# Patient Record
Sex: Female | Born: 1980 | Race: White | Hispanic: Yes | Marital: Single | Smoking: Never smoker
Health system: Southern US, Community
[De-identification: ages and names within clinical notes are randomized; demographics above are authoritative.]

## PROBLEM LIST (undated history)

## (undated) ENCOUNTER — Inpatient Hospital Stay (HOSPITAL_COMMUNITY): Payer: Self-pay

## (undated) DIAGNOSIS — F329 Major depressive disorder, single episode, unspecified: Secondary | ICD-10-CM

## (undated) DIAGNOSIS — R51 Headache: Secondary | ICD-10-CM

## (undated) DIAGNOSIS — R519 Headache, unspecified: Secondary | ICD-10-CM

## (undated) DIAGNOSIS — K219 Gastro-esophageal reflux disease without esophagitis: Secondary | ICD-10-CM

## (undated) DIAGNOSIS — Z91410 Personal history of adult physical and sexual abuse: Secondary | ICD-10-CM

## (undated) DIAGNOSIS — K117 Disturbances of salivary secretion: Secondary | ICD-10-CM

## (undated) DIAGNOSIS — F32A Depression, unspecified: Secondary | ICD-10-CM

## (undated) HISTORY — DX: Personal history of adult physical and sexual abuse: Z91.410

## (undated) HISTORY — DX: Disturbances of salivary secretion: K11.7

## (undated) HISTORY — DX: Depression, unspecified: F32.A

## (undated) HISTORY — DX: Major depressive disorder, single episode, unspecified: F32.9

---

## 2009-08-09 ENCOUNTER — Ambulatory Visit: Payer: Self-pay | Admitting: Family Medicine

## 2009-08-09 ENCOUNTER — Inpatient Hospital Stay (HOSPITAL_COMMUNITY): Admission: AD | Admit: 2009-08-09 | Discharge: 2009-08-12 | Payer: Self-pay | Admitting: Obstetrics & Gynecology

## 2009-08-21 ENCOUNTER — Ambulatory Visit: Payer: Self-pay | Admitting: Obstetrics & Gynecology

## 2009-08-21 ENCOUNTER — Inpatient Hospital Stay (HOSPITAL_COMMUNITY): Admission: AD | Admit: 2009-08-21 | Discharge: 2009-08-22 | Payer: Self-pay | Admitting: Obstetrics & Gynecology

## 2009-10-29 ENCOUNTER — Ambulatory Visit (HOSPITAL_COMMUNITY): Admission: RE | Admit: 2009-10-29 | Discharge: 2009-10-29 | Payer: Self-pay | Admitting: Obstetrics & Gynecology

## 2010-03-23 ENCOUNTER — Inpatient Hospital Stay (HOSPITAL_COMMUNITY): Admission: AD | Admit: 2010-03-23 | Discharge: 2010-03-23 | Payer: Self-pay | Admitting: Obstetrics & Gynecology

## 2010-03-23 ENCOUNTER — Ambulatory Visit: Payer: Self-pay | Admitting: Family

## 2010-03-24 ENCOUNTER — Inpatient Hospital Stay (HOSPITAL_COMMUNITY): Admission: AD | Admit: 2010-03-24 | Discharge: 2010-03-27 | Payer: Self-pay | Admitting: Obstetrics & Gynecology

## 2010-03-24 ENCOUNTER — Ambulatory Visit: Payer: Self-pay | Admitting: Family Medicine

## 2010-12-18 LAB — URINE MICROSCOPIC-ADD ON

## 2010-12-18 LAB — CBC
HCT: 30.9 % — ABNORMAL LOW (ref 36.0–46.0)
Hemoglobin: 10.4 g/dL — ABNORMAL LOW (ref 12.0–15.0)
Hemoglobin: 11.7 g/dL — ABNORMAL LOW (ref 12.0–15.0)
MCH: 28.5 pg (ref 26.0–34.0)
MCH: 28.9 pg (ref 26.0–34.0)
MCHC: 34.3 g/dL (ref 30.0–36.0)
MCV: 84.4 fL (ref 78.0–100.0)
MCV: 84.5 fL (ref 78.0–100.0)
Platelets: 147 10*3/uL — ABNORMAL LOW (ref 150–400)
RBC: 3.66 MIL/uL — ABNORMAL LOW (ref 3.87–5.11)
RBC: 4.05 MIL/uL (ref 3.87–5.11)
WBC: 6.2 10*3/uL (ref 4.0–10.5)

## 2010-12-18 LAB — COMPREHENSIVE METABOLIC PANEL
AST: 23 U/L (ref 0–37)
Albumin: 2.5 g/dL — ABNORMAL LOW (ref 3.5–5.2)
Alkaline Phosphatase: 163 U/L — ABNORMAL HIGH (ref 39–117)
BUN: 6 mg/dL (ref 6–23)
CO2: 19 mEq/L (ref 19–32)
Chloride: 108 mEq/L (ref 96–112)
Creatinine, Ser: 0.42 mg/dL (ref 0.4–1.2)
GFR calc non Af Amer: >60 mL/min (ref >60)
Potassium: 3.4 mEq/L — ABNORMAL LOW (ref 3.5–5.1)
Total Bilirubin: 0.4 mg/dL (ref 0.3–1.2)

## 2010-12-18 LAB — URINALYSIS, ROUTINE W REFLEX MICROSCOPIC
Bilirubin Urine: NEGATIVE
Ketones, ur: NEGATIVE mg/dL
Nitrite: NEGATIVE
Protein, ur: 100 mg/dL — AB
Urobilinogen, UA: 0.2 mg/dL (ref 0.0–1.0)

## 2010-12-18 LAB — PROTEIN / CREATININE RATIO, URINE: Creatinine, Urine: 107.5 mg/dL

## 2011-01-04 LAB — COMPREHENSIVE METABOLIC PANEL
ALT: 58 U/L — ABNORMAL HIGH (ref 0–35)
ALT: 71 U/L — ABNORMAL HIGH (ref 0–35)
AST: 38 U/L — ABNORMAL HIGH (ref 0–37)
Albumin: 2.7 g/dL — ABNORMAL LOW (ref 3.5–5.2)
Alkaline Phosphatase: 50 U/L (ref 39–117)
Alkaline Phosphatase: 66 U/L (ref 39–117)
BUN: 3 mg/dL — ABNORMAL LOW (ref 6–23)
CO2: 23 mEq/L (ref 19–32)
CO2: 24 mEq/L (ref 19–32)
CO2: 27 mEq/L (ref 19–32)
Calcium: 8.6 mg/dL (ref 8.4–10.5)
Chloride: 110 mEq/L (ref 96–112)
Chloride: 105 mEq/L (ref 96–112)
Creatinine, Ser: 0.44 mg/dL (ref 0.4–1.2)
GFR calc Af Amer: >60 mL/min (ref >60)
GFR calc non Af Amer: >60 mL/min (ref >60)
GFR calc non Af Amer: >60 mL/min (ref >60)
GFR calc non Af Amer: >60 mL/min (ref >60)
Glucose, Bld: 102 mg/dL — ABNORMAL HIGH (ref 70–99)
Glucose, Bld: 107 mg/dL — ABNORMAL HIGH (ref 70–99)
Potassium: 3.6 mEq/L (ref 3.5–5.1)
Potassium: 4 mEq/L (ref 3.5–5.1)
Sodium: 134 mEq/L — ABNORMAL LOW (ref 135–145)
Sodium: 138 mEq/L (ref 135–145)
Total Bilirubin: 0.6 mg/dL (ref 0.3–1.2)
Total Protein: 5.7 g/dL — ABNORMAL LOW (ref 6.0–8.3)

## 2011-01-04 LAB — DIFFERENTIAL
Basophils Relative: 1 % (ref 0–1)
Eosinophils Absolute: 0 10*3/uL (ref 0.0–0.7)
Eosinophils Relative: 0 % (ref 0–5)
Lymphocytes Relative: 12 % (ref 12–46)
Lymphs Abs: 1.4 10*3/uL (ref 0.7–4.0)
Monocytes Absolute: 0.6 10*3/uL (ref 0.1–1.0)
Monocytes Relative: 5 % (ref 3–12)
Neutrophils Relative %: 79 % — ABNORMAL HIGH (ref 43–77)

## 2011-01-04 LAB — POCT PREGNANCY, URINE: Preg Test, Ur: POSITIVE

## 2011-01-04 LAB — URINALYSIS, ROUTINE W REFLEX MICROSCOPIC
Bilirubin Urine: NEGATIVE
Glucose, UA: NEGATIVE mg/dL
Hgb urine dipstick: NEGATIVE
Ketones, ur: 15 mg/dL — AB
Ketones, ur: NEGATIVE mg/dL
Nitrite: NEGATIVE
Protein, ur: NEGATIVE mg/dL
Specific Gravity, Urine: 1.025 (ref 1.005–1.030)
pH: 5.5 (ref 5.0–8.0)

## 2011-01-04 LAB — URINE MICROSCOPIC-ADD ON

## 2011-01-04 LAB — BASIC METABOLIC PANEL
GFR calc Af Amer: >60 mL/min (ref >60)
GFR calc non Af Amer: >60 mL/min (ref >60)
Potassium: 2.7 mEq/L — CL (ref 3.5–5.1)
Sodium: 133 mEq/L — ABNORMAL LOW (ref 135–145)

## 2011-01-04 LAB — CBC
HCT: 32.7 % — ABNORMAL LOW (ref 36.0–46.0)
HCT: 45.1 % (ref 36.0–46.0)
Hemoglobin: 15.6 g/dL — ABNORMAL HIGH (ref 12.0–15.0)
Hemoglobin: 14.1 g/dL (ref 12.0–15.0)
MCV: 94 fL (ref 78.0–100.0)
Platelets: 183 10*3/uL (ref 150–400)
Platelets: 250 10*3/uL (ref 150–400)
RBC: 4.85 MIL/uL (ref 3.87–5.11)
RBC: 4.4 MIL/uL (ref 3.87–5.11)
WBC: 11.7 10*3/uL — ABNORMAL HIGH (ref 4.0–10.5)
WBC: 6.4 10*3/uL (ref 4.0–10.5)

## 2011-01-04 LAB — HEPATITIS C VRS RNA DETECT BY PCR-QUAL: Hepatitis C Vrs RNA by PCR-Qual: NEGATIVE

## 2011-01-04 LAB — TSH: TSH: 1.149 u[IU]/mL (ref 0.350–4.500)

## 2011-02-14 IMAGING — US US ABDOMEN COMPLETE
1 series · 14 of 25 positions shown · non-contrast
Comparison: None.

CLINICAL DATA: Diffuse abdominal pain.  Nausea vomiting.  Elevated
LFTs.  Leukocytosis.

ABDOMEN ULTRASOUND
TECHNIQUE: Complete abdominal ultrasound examination was performed
including evaluation of the liver, gallbladder, bile ducts,
pancreas, kidneys, spleen, IVC, and abdominal aorta.

[Series 1: us abdomen complete · 0.21mm/px · 14 of 98 slices shown]
[im 1/98]
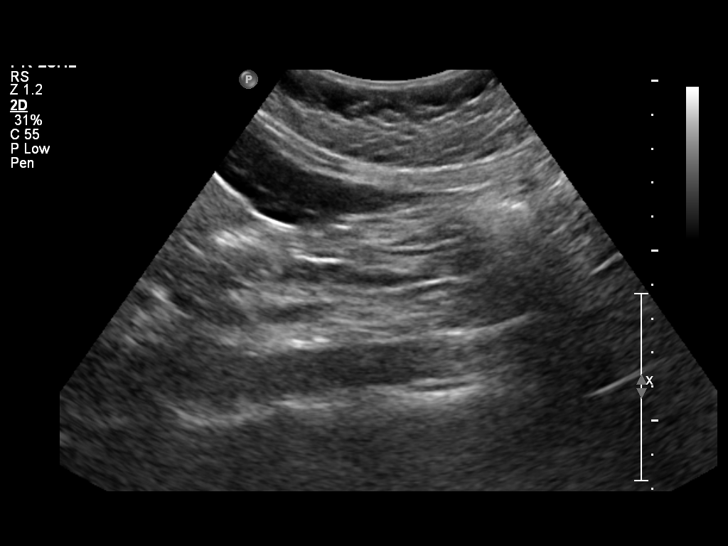
[im 9/98]
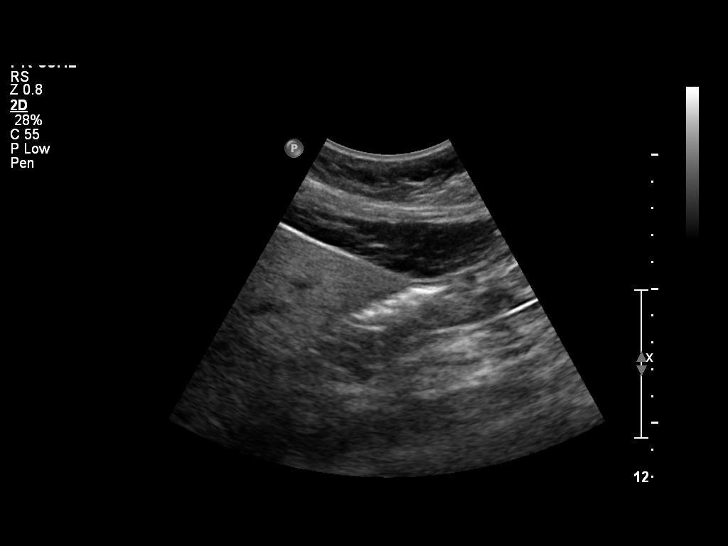
[im 17/98]
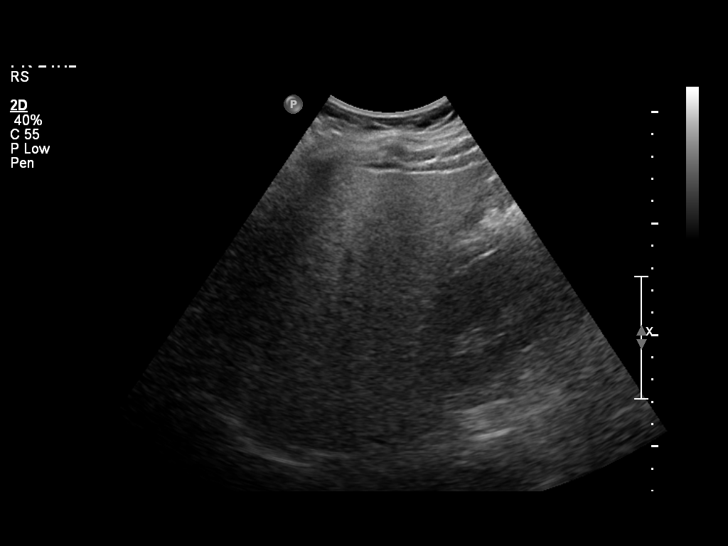
[im 25/98]
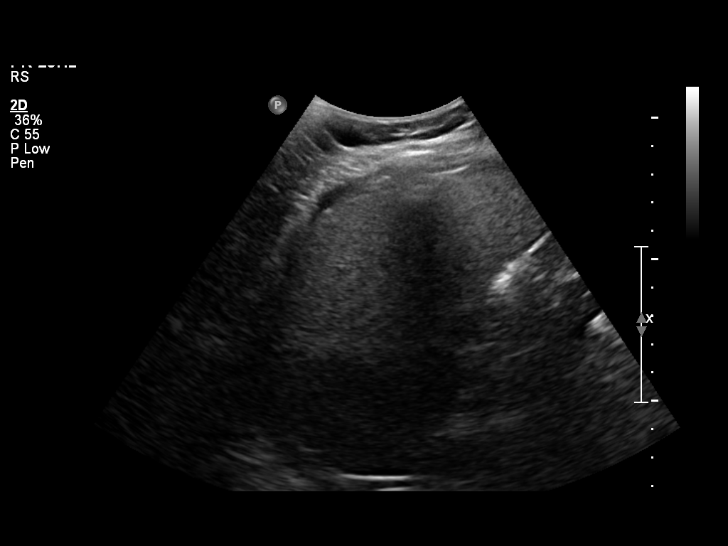
[im 33/98]
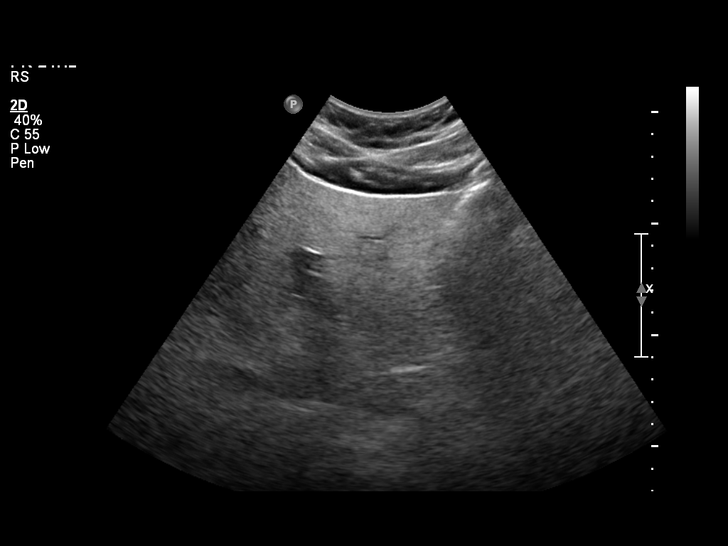
[im 37/98]
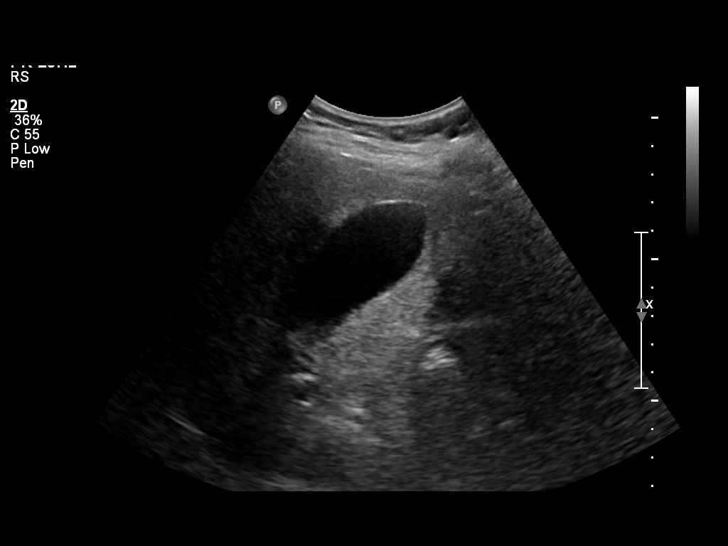
[im 45/98]
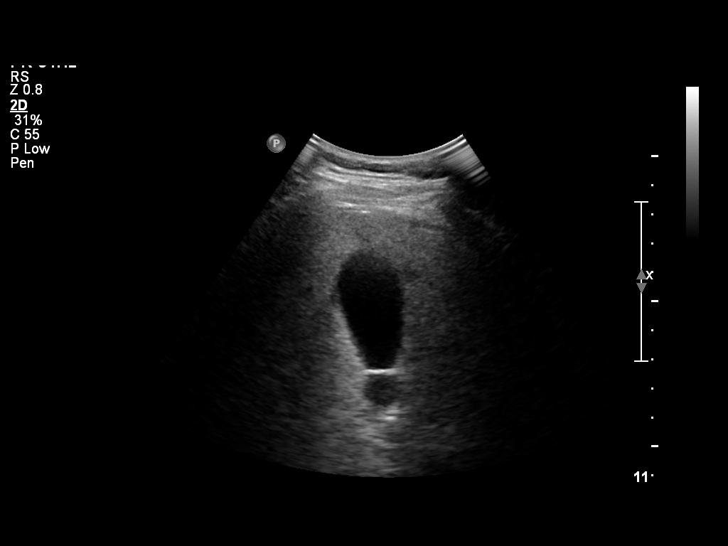
[im 53/98]
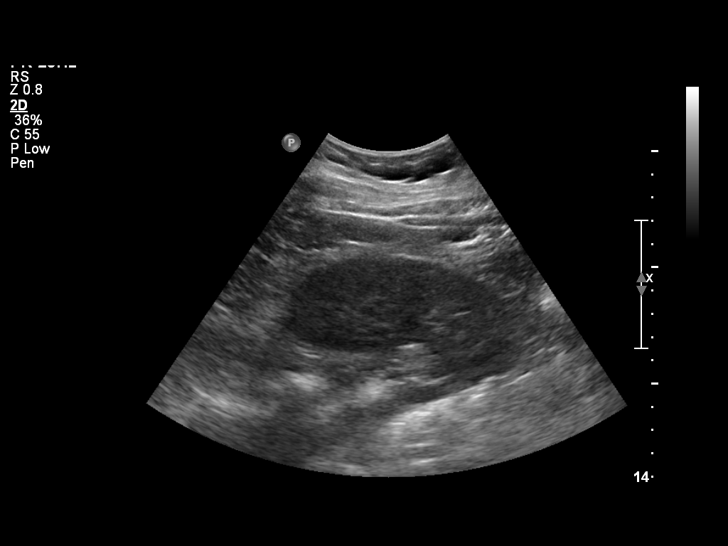
[im 61/98]
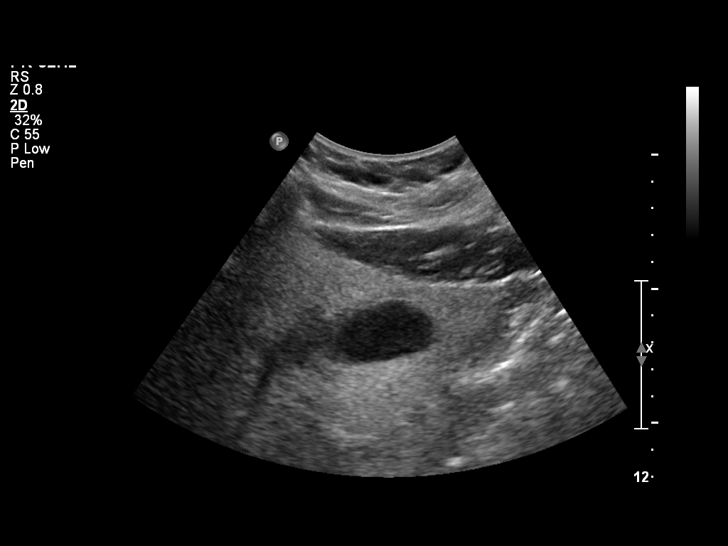
[im 65/98]
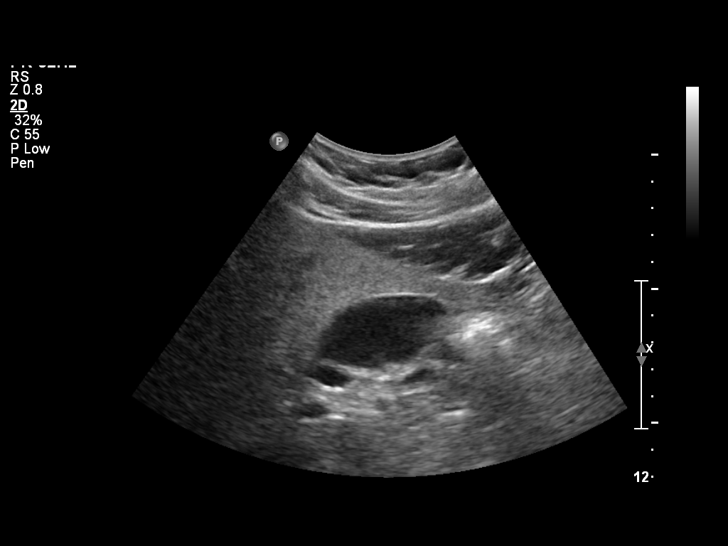
[im 73/98]
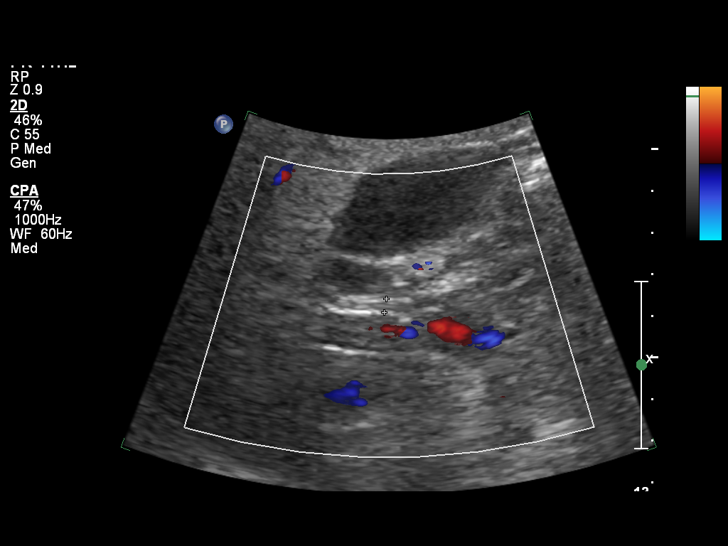
[im 81/98]
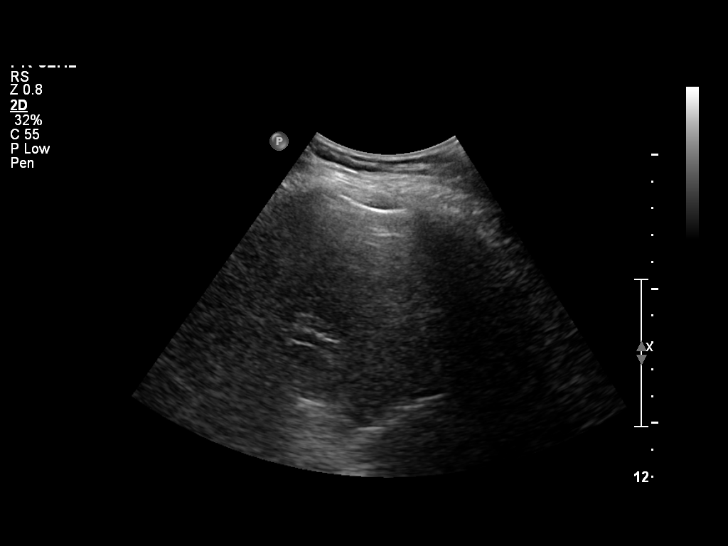
[im 89/98]
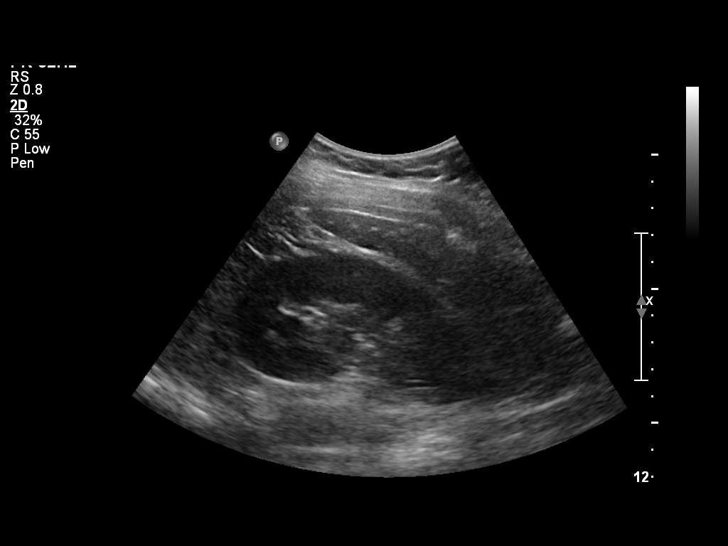
[im 98/98]
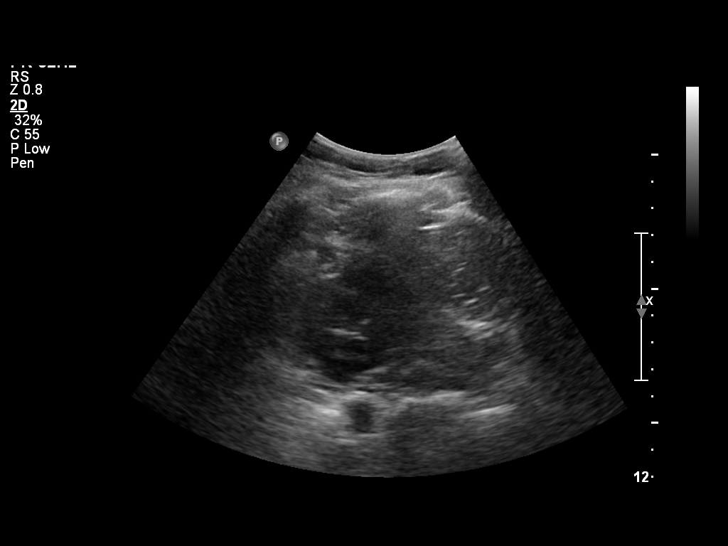

[14 of 25 positions shown; findings below may reference images not displayed]

FINDINGS: Gallbladder:  There is no evidence for gallstones.  No gallbladder
wall thickening or pericholecystic fluid.  The sonographer reports
no sonographic Murphy's sign.

Common Bile Duct:  Nondilated at 3-4 mm diameter.

Liver:  No focal abnormality.  Diffuse coarsening of the
echotexture is consistent with fatty infiltration.  No intrahepatic
biliary duct dilatation is evident.

IVC:  Normal.

Pancreas:  Obscured by overlying bowel gas.

Spleen:  Normal.

Right kidney:  9.4 cm in long axis.  Normal.

Left kidney:  9.2 cm in long axis.  Normal.

Abdominal Aorta:  No aneurysm.
IMPRESSION: Fatty infiltration of the liver.  Otherwise unremarkable ultrasound
exam of the abdomen with obscuration of the pancreas secondary to
overlying bowel gas.

## 2011-02-15 IMAGING — US US OB COMP LESS 14 WK
1 series · 14 of 21 positions shown · non-contrast
Comparison: None.

CLINICAL DATA: Nausea vomiting.  Positive pregnancy test.

OBSTETRIC <14 WK ULTRASOUND
TECHNIQUE: Transabdominal ultrasound was performed for evaluation
of the gestation as well as the maternal uterus and adnexal
regions.

[Series 1: us ob comp less 14 wks · 0.21mm/px · 14 of 21 slices shown]
[im 1/21]
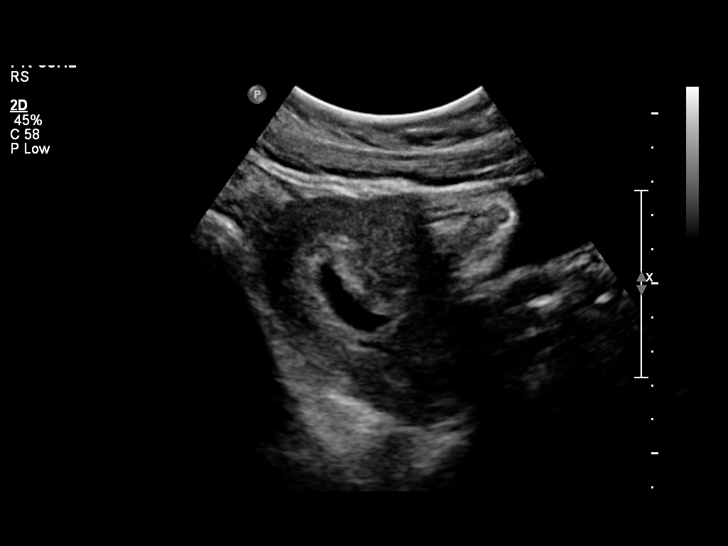
[im 3/21]
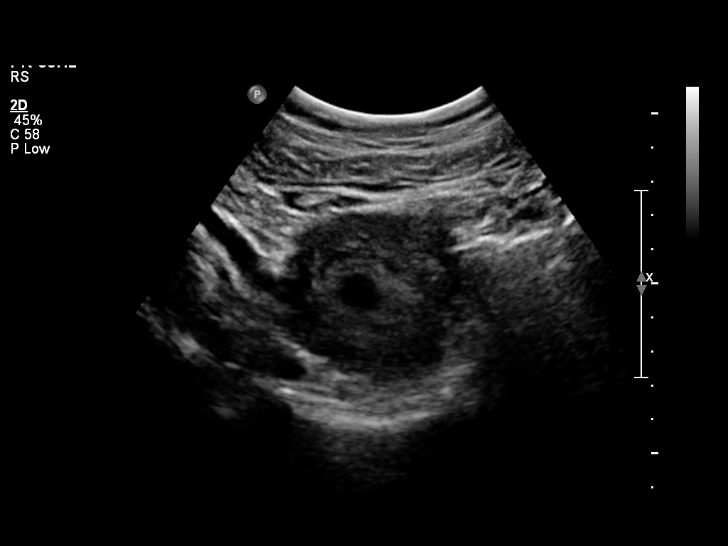
[im 4/21]
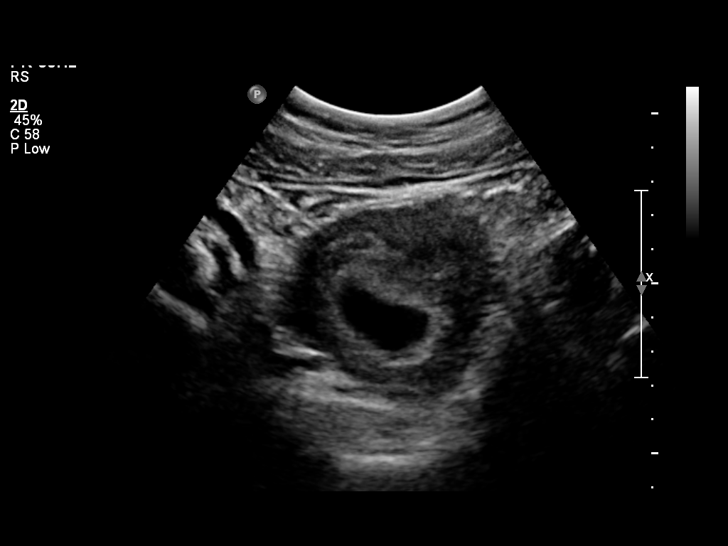
[im 6/21]
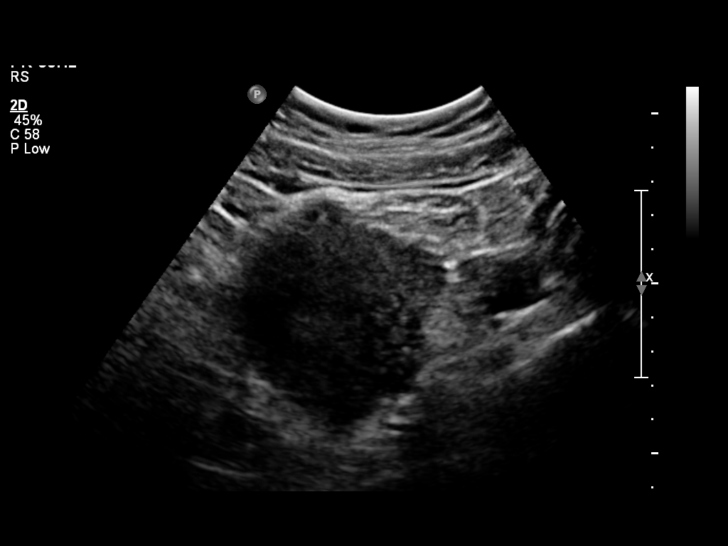
[im 7/21]
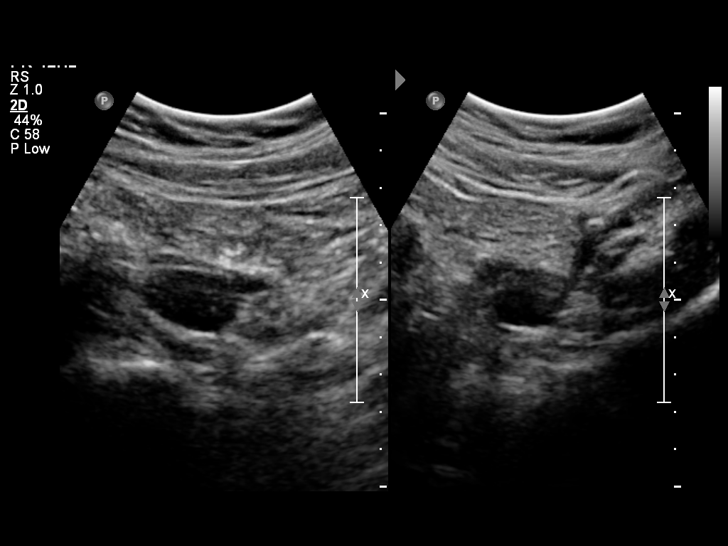
[im 9/21]
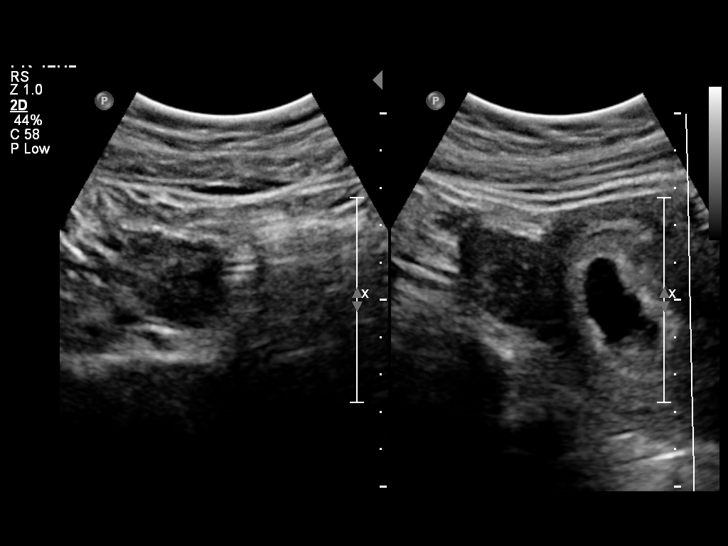
[im 10/21]
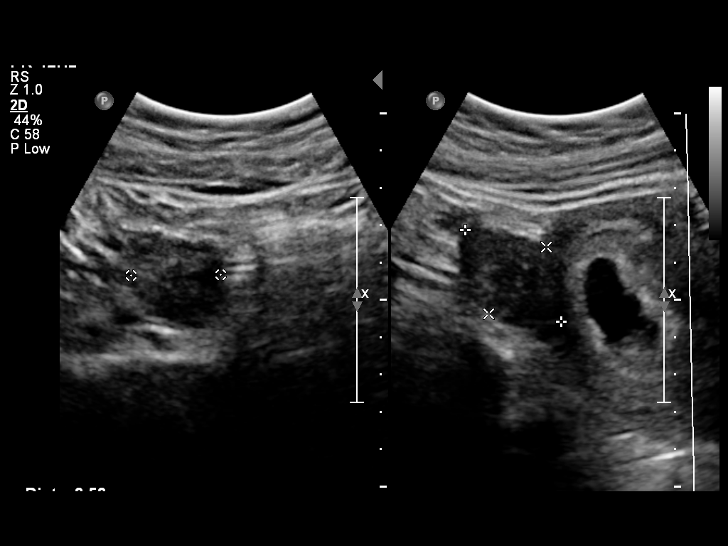
[im 12/21]
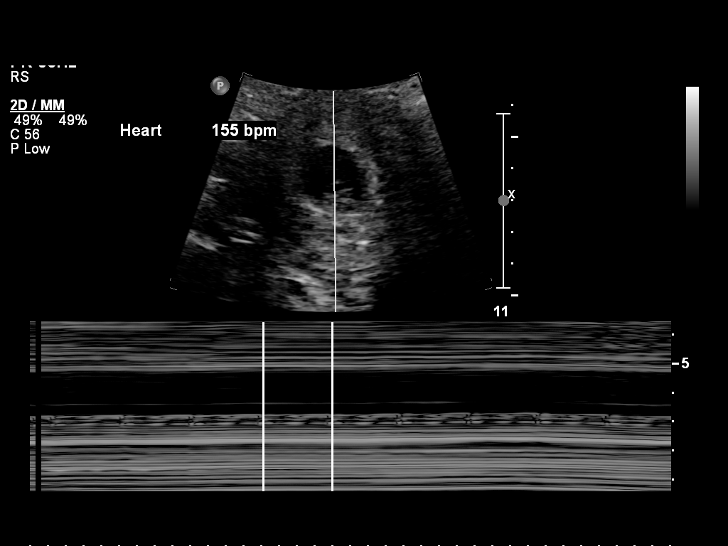
[im 13/21]
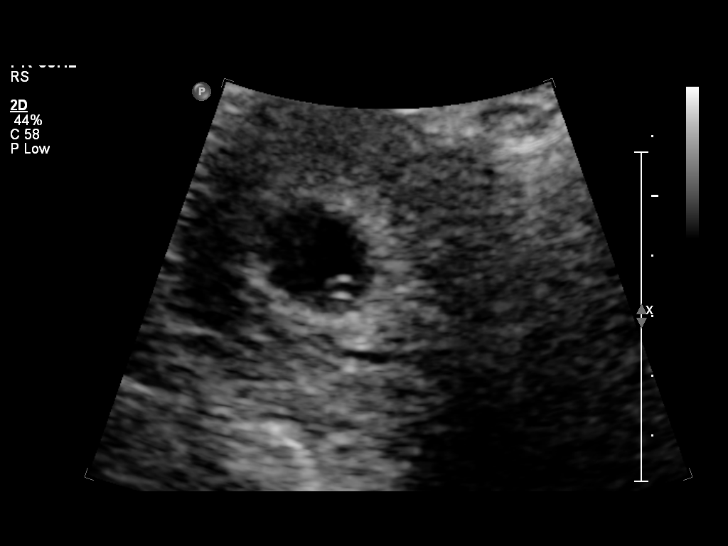
[im 15/21]
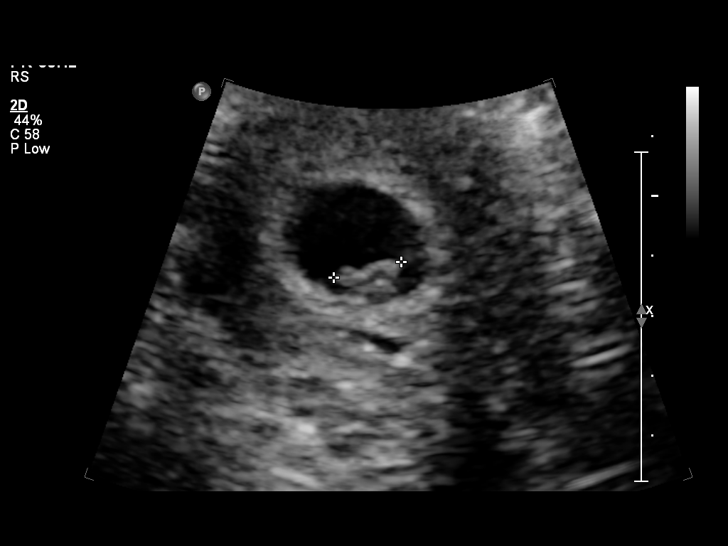
[im 16/21]
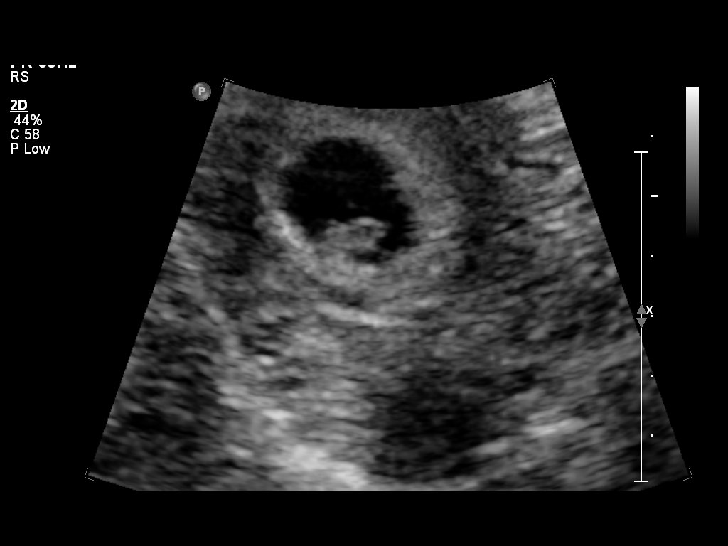
[im 18/21]
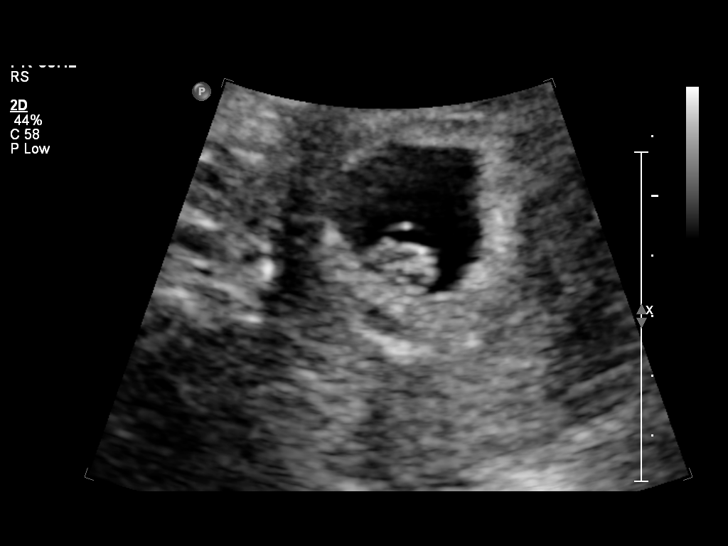
[im 19/21]
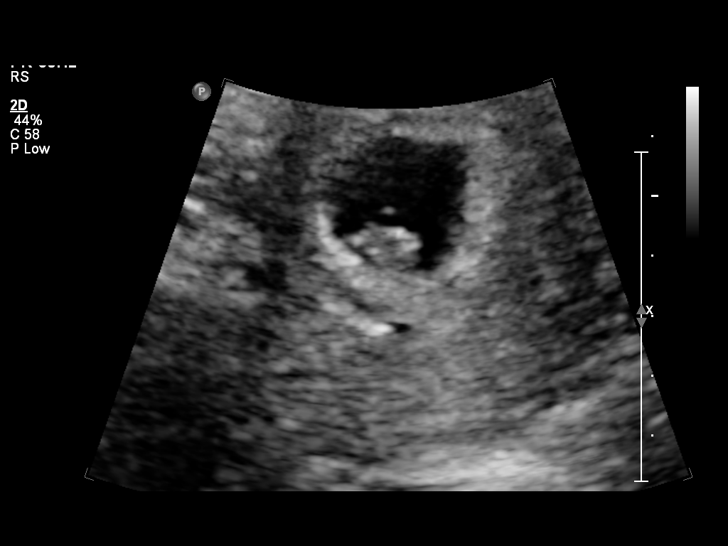
[im 21/21]
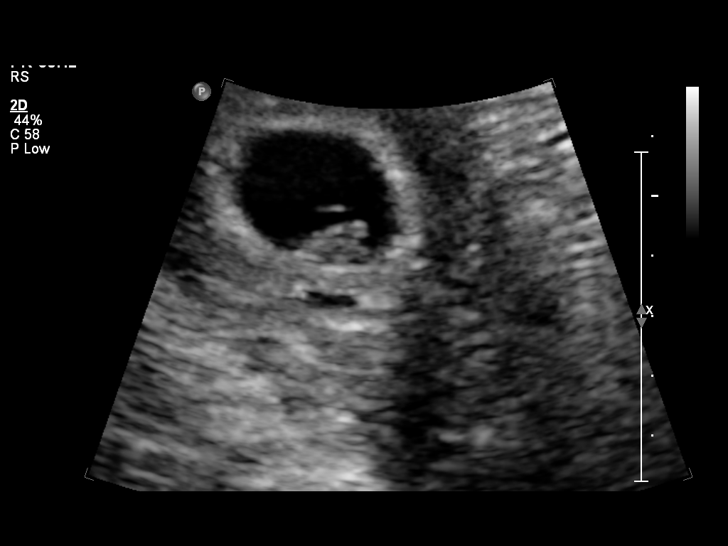

[14 of 21 positions shown; findings below may reference images not displayed]

Intrauterine gestational sac: Single
Yolk sac: Visualized
Embryo: Visualized
Cardiac Activity: Visualized
Heart Rate: 155 bpm

CRL:  1.1 cm         7w  2d         US EDC: 03/27/2010

Maternal uterus/Adnexae:
No evidence for subchorionic hemorrhage.  The maternal ovaries are
normal bilaterally.  No evidence for free fluid in the cul-de-sac.
IMPRESSION: Single living intrauterine gestation at an estimated 7-week-3-day
gestational age by crown-rump length.

## 2014-12-06 ENCOUNTER — Encounter (HOSPITAL_COMMUNITY): Payer: Self-pay | Admitting: Emergency Medicine

## 2014-12-06 ENCOUNTER — Emergency Department (HOSPITAL_COMMUNITY)
Admission: EM | Admit: 2014-12-06 | Discharge: 2014-12-06 | Disposition: A | Discharge status: Home / Self Care | Payer: Self-pay | Attending: Emergency Medicine | Admitting: Emergency Medicine

## 2014-12-06 DIAGNOSIS — J3489 Other specified disorders of nose and nasal sinuses: Secondary | ICD-10-CM | POA: Insufficient documentation

## 2014-12-06 DIAGNOSIS — J029 Acute pharyngitis, unspecified: Secondary | ICD-10-CM | POA: Insufficient documentation

## 2014-12-06 DIAGNOSIS — M791 Myalgia, unspecified site: Secondary | ICD-10-CM

## 2014-12-06 DIAGNOSIS — K029 Dental caries, unspecified: Secondary | ICD-10-CM | POA: Insufficient documentation

## 2014-12-06 DIAGNOSIS — Z3202 Encounter for pregnancy test, result negative: Secondary | ICD-10-CM | POA: Insufficient documentation

## 2014-12-06 LAB — POC URINE PREG, ED: Preg Test, Ur: NEGATIVE

## 2014-12-06 LAB — URINE MICROSCOPIC-ADD ON

## 2014-12-06 LAB — URINALYSIS, ROUTINE W REFLEX MICROSCOPIC
Bilirubin Urine: NEGATIVE
Glucose, UA: NEGATIVE mg/dL
KETONES UR: NEGATIVE mg/dL
Nitrite: NEGATIVE
PROTEIN: NEGATIVE mg/dL
Specific Gravity, Urine: 1.021 (ref 1.005–1.030)
Urobilinogen, UA: 0.2 mg/dL (ref 0.0–1.0)
pH: 5.5 (ref 5.0–8.0)

## 2014-12-06 MED ORDER — DIPHENHYDRAMINE HCL 25 MG PO TABS: 50.0000 mg | ORAL_TABLET | ORAL | Status: DC | PRN

## 2014-12-06 NOTE — ED Notes (Signed)
pt. Stated, Wednesday face hurts, and when I drink water it hurts.

## 2014-12-06 NOTE — ED Notes (Signed)
Ok with MD to discharge without u/a results.

## 2014-12-06 NOTE — Discharge Instructions (Signed)
Infecciones virales  (Viral Infections)  Un virus es un tipo de germen. Puede causar:   Dolor de garganta leve.  Dolores musculares.  Dolor de [place].  Secrecin nasal.  Erupciones.  Lagrimeo.  Cansancio.  Tos.  Prdida del apetito.  Ganas de vomitar (nuseas).  Vmitos.  Materia fecal lquida (diarrea). CUIDADOS EN EL HOGAR   Tome la medicacin slo como le haya indicado el mdico.  Beba gran cantidad de lquido para mantener la orina de tono claro o color amarillo plido. Las bebidas deportivas son Pamala Hurry eleccin.  Descanse lo suficiente y [employer]. Puede tomar sopas y caldos con crackers o arroz. SOLICITE AYUDA DE INMEDIATO SI:   Siente un dolor de cabeza muy intenso.  Le falta el aire.  Tiene dolor en el pecho o en el cuello.  Tiene una erupcin que no tena antes.  No puede detener los vmitos.  Tiene una hemorragia que no se detiene.  No puede retener los lquidos.  Usted o el nio tienen una temperatura oral le sube a ms de 38,9 C (102 F), y no puede bajarla con medicamentos.  Su beb tiene ms de 3 meses y su temperatura rectal es de 102 F (38.9 C) o ms.  Su beb tiene 3 meses o menos y su temperatura rectal es de 100.4 F (38 C) o ms. ASEGRESE DE QUE:   Comprende estas instrucciones.  Controlar la enfermedad.  Solicitar ayuda de inmediato si no mejora o si empeora. Document Released: 02/20/2011 Document Revised: 12/11/2011 [facility] Patient Information 2015 [place]. This information is not intended to replace advice given to you by your health care provider. Make sure you discuss any questions you have with your health care provider. Reaccin anafilctica  (Anaphylactic Reaction)  La reaccin anafilctica es una reaccin alrgica sbita y grave que involucra a todo el cuerpo. Posiblemente ponga en peligro la vida. En muchos casos ser necesaria la hospitalizacin. Las personas con asma, eccema o fiebre del heno  son ligeramente ms propensos a sufrir Chemical engineer.  CAUSAS  La causa puede ser cualquier cosa que le produzca alergia. Despus de la exposicin a la sustancia que le produce la alergia, el sistema inmunitario se vuelve sensible a ella. Cuando se expone a la misma sustancia nuevamente, se puede producir Nurse, mental health. Las causas ms comunes de una reaccin anafilctica son:   Chief Strategy Officer.  Algunos alimentos, especialmente los cacahuetes, trigo, [place], [place] y [place].  Las picaduras o mordeduras de insectos.  Los productos de Herbalist.  Algunos productos qumicos, tales como tinturas, ltex y la sustancia de [place] para algunos estudios por imgenes. SNTOMAS  Cuando se produce una reaccin alrgica, el organismo libera histamina y otras sustancias. Estas sustancias causan sntomas tales como el estrechamiento de las vas areas. Generalmente los sntomas aparecen en cuestin de segundos o minutos despus de la exposicin. Los sntomas pueden ser:   Erupcin o ronchas en la piel.  Picazn.  Opresin en el pecho.  Hinchazn en los ojos , lengua o los labios.  Dificultad para respirar o tragar.  Mareos o [employer].  Ansiedad o confusin.  Dolor de Paramedic, vmitos o diarrea.  Congestin nasal.  Latidos cardacos rpidos o irregulares (palpitaciones). DIAGNSTICO  El diagnstico se basa en su historial de exposicin reciente a sustancias alrgicas, a los sntomas y en el examen fsico. Su mdico tambin puede ordenar exmenes de sangre y [place] para Firefighter el diagnstico.  TRATAMIENTO  El medicamento llamado epinefrina es el principal  tratamiento para una reaccin anafilctica. Otros medicamentos que pueden utilizarse en el tratamiento incluyen antihistamnicos, corticoides y albuterol. En los casos graves, se administran lquidos y medicamentos por va intravenosa (IV) para compensar la presin arterial. Aunque mejore despus del  tratamiento, es necesario quedar bajo observacin para asegurarse de que su afeccin no empeore. Puede requerir hospitalizacin.  INSTRUCCIONES PARA EL CUIDADO EN EL HOGAR   Utilice un brazalete o collar de alerta mdico, indicando que usted es alrgico.  Usted y su familia deben aprender como usar el kit para la anafilaxis o a Architectural technologist una inyeccin de epinefrina para tratar transitoriamente una reaccin alrgica de emergencia. Lleve siempre la inyeccin de epinefrina o el kit de anafilaxis con usted. Esto podr salvarle la vida si tiene una reaccin grave.  No No conduzca ni realice tareas despus del tratamiento hasta que haya terminado los medicamentos o hasta que tenga la autorizacin del mdico.  Si tiene un sarpullido o erupcin cutnea:  [employer] medicamentos segn le indic su mdico.  Puede utilizar un antihistamnico de venta libre (difenhidramina) para la urticaria y la picazn, segn sea necesario.  Aplquese compresas fras sobre la piel o tome baos de agua fra. Evite los [place] calientes. SOLICITE ATENCIN MDICA SI:   Presenta sntomas de una reaccin alrgica a una nueva sustancia. Los sntomas pueden aparecer inmediatamente o minutos despus.  Aparece una erupcin cutnea, tos o dolor de odos.  Presenta nuevos sntomas. SOLICITE ATENCIN MDICA DE INMEDIATO SI:   Tiene hinchazn en la boca, jadeos o dificultad respiratoria.  Tiene una sensacin de opresin en el pecho o en la garganta.  Presenta sarpullido, hinchazn o picazn en todo el cuerpo.  Presenta diarrea o vmitos.  Sufre mareos o se desmaya. Esto es Engineer, maintenance (IT). Use la inyeccin de epinefrina o el kit para anafilaxis del modo en que le han indicado. Llame a los servicios de emergencia locales (911 en [place]).  Aunque haya mejorado luego de la inyeccin, deber examinarse en el departamento de emergencias del hospital.  ASEGRESE DE QUE:   Comprende estas  instrucciones.  Controlar su enfermedad.  Solicitar ayuda de inmediato si no mejora o si empeora. Document Released: 09/18/2005 Document Revised: 09/23/2013 [facility] Patient Information 2015 [place]. This information is not intended to replace advice given to you by your health care provider. Make sure you discuss any questions you have with your health care provider.

## 2014-12-06 NOTE — ED Provider Notes (Signed)
CSN: [id number]     Arrival date & time 12/06/14  [id number] History   First MD Initiated Contact with Patient 12/06/14 1042     Chief Complaint  Patient presents with  . Headache  . Sore Throat     (Consider location/radiation/quality/duration/timing/severity/associated sxs/prior Treatment) Patient is a 34 y.o. female presenting with headaches and pharyngitis.  Headache Pain location:  Generalized Quality:  Dull Radiates to:  Does not radiate Onset quality:  Gradual Duration:  2 days Timing:  Constant Progression:  Unchanged Chronicity:  New Similar to prior headaches: yes   Context comment:  URI symptoms of congestion, facial swelling, facial pain, ate shrimp 2 days ago Relieved by:  Nothing Worsened by:  Nothing Ineffective treatments:  None tried Associated symptoms: no abdominal pain, no fever, no nausea, no numbness and no vomiting   Associated symptoms comment:  Pain with swallowing Sore Throat Associated symptoms include headaches. Pertinent negatives include no abdominal pain.    History reviewed. No pertinent past medical history. History reviewed. No pertinent past surgical history. No family history on file. History  Substance Use Topics  . Smoking status: Never Smoker   . Smokeless tobacco: Not on file  . Alcohol Use: No   OB History    No data available     Review of Systems  Constitutional: Negative for fever.  Gastrointestinal: Negative for nausea, vomiting and abdominal pain.  Neurological: Positive for headaches. Negative for numbness.  All other systems reviewed and are negative.     Allergies  Review of patient's allergies indicates not on file.  Home Medications   Prior to Admission medications   Medication Sig Start Date End Date Taking? Authorizing Provider  diphenhydrAMINE (BENADRYL) 25 MG tablet Take 2 tablets (50 mg total) by mouth every 4 (four) hours as needed for itching. 12/06/14   Noel GeroldMatt Quintavious Rinck, MD   BP 118/79 mmHg  Pulse 73   Temp(Src) 98.2 F (36.8 C) (Oral)  Resp 16  SpO2 100%  LMP 12/03/2014 Physical Exam  Constitutional: She is oriented to person, place, and time. She appears well-developed and well-nourished.  HENT:  Head: Normocephalic and atraumatic.  Right Ear: External ear normal.  Left Ear: External ear normal.  Mouth/Throat: Uvula is midline, oropharynx is clear and moist and mucous membranes are normal. No oral lesions. No trismus in the jaw. Normal dentition. Dental caries present. No dental abscesses or uvula swelling. No posterior oropharyngeal edema or posterior oropharyngeal erythema.  Eyes: Conjunctivae and EOM are normal. Pupils are equal, round, and reactive to light.  Neck: Normal range of motion. Neck supple.  Cardiovascular: Normal rate, regular rhythm, normal heart sounds and intact distal pulses.   Pulmonary/Chest: Effort normal and breath sounds normal.  Abdominal: Soft. Bowel sounds are normal. There is no tenderness.  Musculoskeletal: Normal range of motion.  Neurological: She is alert and oriented to person, place, and time.  Skin: Skin is warm and dry.  Vitals reviewed.   ED Course  Procedures (including critical care time) Labs Review Labs Reviewed  URINALYSIS, ROUTINE W REFLEX MICROSCOPIC - Abnormal; Notable for the following:    Color, Urine AMBER (*)    APPearance CLOUDY (*)    Hgb urine dipstick LARGE (*)    Leukocytes, UA MODERATE (*)    All other components within normal limits  URINE MICROSCOPIC-ADD ON - Abnormal; Notable for the following:    Squamous Epithelial / LPF FEW (*)    Bacteria, UA FEW (*)    All other components  within normal limits  POC URINE PREG, ED    Imaging Review No results found.   EKG Interpretation None      MDM   Final diagnoses:  Sinus pain  Myalgia    34 y.o. female without pertinent PMH presents with facial pain, sore throat, runny nose, myalgias consistent with viral process.  She also states she had facial swelling and  pain after ingesting shellfish 2 days ago.  On arrival today pt has vitals and physical exam as above, benign.  No meningitic signs.  No dysuria.  No fevers.  DC home in stable condition with anaphylaxis precautions, likely viral syndrome.   I have reviewed all laboratory and imaging studies if ordered as above  1. Sinus pain   2. Myalgia         Noel Gerold, MD 12/09/14 1610

## 2015-06-16 ENCOUNTER — Encounter (HOSPITAL_COMMUNITY): Payer: Self-pay

## 2015-06-16 ENCOUNTER — Inpatient Hospital Stay (HOSPITAL_COMMUNITY)
Admission: AD | Admit: 2015-06-16 | Discharge: 2015-06-17 | Disposition: A | Discharge status: Home / Self Care | Payer: Self-pay | Source: Ambulatory Visit | Attending: Obstetrics & Gynecology | Admitting: Obstetrics & Gynecology

## 2015-06-16 DIAGNOSIS — O99611 Diseases of the digestive system complicating pregnancy, first trimester: Secondary | ICD-10-CM | POA: Insufficient documentation

## 2015-06-16 DIAGNOSIS — Z3A13 13 weeks gestation of pregnancy: Secondary | ICD-10-CM | POA: Insufficient documentation

## 2015-06-16 DIAGNOSIS — O219 Vomiting of pregnancy, unspecified: Secondary | ICD-10-CM

## 2015-06-16 DIAGNOSIS — O210 Mild hyperemesis gravidarum: Secondary | ICD-10-CM | POA: Insufficient documentation

## 2015-06-16 DIAGNOSIS — K117 Disturbances of salivary secretion: Secondary | ICD-10-CM | POA: Insufficient documentation

## 2015-06-16 LAB — URINE MICROSCOPIC-ADD ON

## 2015-06-16 LAB — URINALYSIS, ROUTINE W REFLEX MICROSCOPIC
Bilirubin Urine: NEGATIVE
GLUCOSE, UA: NEGATIVE mg/dL
HGB URINE DIPSTICK: NEGATIVE
Ketones, ur: 15 mg/dL — AB
Nitrite: NEGATIVE
PH: 6.5 (ref 5.0–8.0)
PROTEIN: NEGATIVE mg/dL
SPECIFIC GRAVITY, URINE: 1.025 (ref 1.005–1.030)
Urobilinogen, UA: 0.2 mg/dL (ref 0.0–1.0)

## 2015-06-16 NOTE — MAU Note (Signed)
Pt presents via EMS stating she is 77mo pregnant and has been having nausea, vomiting, spitting and headache x2 mo. Also has lower abdominal and upper abdominal pain that started 1 hour ago. LMP 03/14/2015. Denies vaginal bleeding or discharge.

## 2015-06-17 DIAGNOSIS — K117 Disturbances of salivary secretion: Secondary | ICD-10-CM

## 2015-06-17 MED ORDER — GLYCOPYRROLATE 1 MG PO TABS
1.0000 mg | ORAL_TABLET | Freq: Once | ORAL | Status: AC
  Administered 2015-06-17: 1 mg via ORAL
  Filled 2015-06-17: qty 1

## 2015-06-17 MED ORDER — PROMETHAZINE HCL 25 MG PO TABS: 12.5000 mg | ORAL_TABLET | Freq: Four times a day (QID) | ORAL | Status: DC | PRN

## 2015-06-17 MED ORDER — GLYCOPYRROLATE 1 MG PO TABS: 1.0000 mg | ORAL_TABLET | Freq: Three times a day (TID) | ORAL | Status: DC | PRN

## 2015-06-17 MED ORDER — PROMETHAZINE HCL 25 MG PO TABS
25.0000 mg | ORAL_TABLET | Freq: Once | ORAL | Status: AC
  Administered 2015-06-17: 25 mg via ORAL
  Filled 2015-06-17: qty 1

## 2015-06-17 NOTE — Discharge Instructions (Signed)
Nuseas matinales (Morning Sickness) Se denominan nuseas matinales a las ganas de vomitar (nuseas) durante el Psychiatrist. Esta sensacin puede estar acompaada o no de vmitos. Aparecen por la maana, pero puede ser un problema a lo largo de [employer]. Las [employer] son ms frecuentes Physicist, medical trimestre, Biomedical engineer pueden continuar durante todo el [place]. Aunque son molestas, generalmente no causan ningn dao, excepto que presente vmitos continuos e intensos (hiperemesis gravdica). Este problema requiere un tratamiento ms intenso.  CAUSAS  La causa de las nuseas matinales no se conoce completamente pero estas parecen estar relacionadas con los cambios hormonales normales que ocurren durante el [place]. FACTORES DE RIESGO Usted tendr mayor riesgo si:  Tena nuseas o vmitos antes de quedar embarazada.  Tuvo nuseas matinales durante los embarazos previos.  Est embarazada de ms de un beb, por ejemplo mellizos. TRATAMIENTO  No utilice ningn medicamento (recetado, de venta libre ni herbario) para este problema sin consultar con su mdico. El mdico tambin podr Camera operator o recomendar:  Suplementos de vitamina B6.  Medicamentos para las nauseas  La medicina herbal llamada jengibre. INSTRUCCIONES PARA EL CUIDADO EN EL HOGAR   Tome solo medicamentos de venta libre o recetados, segn las indicaciones del mdico.  Tomar un multivitamnico antes de Scientist, research (physical sciences) puede prevenir o disminuir la gravedad de las nuseas matinales en la mayora de las mujeres.  Coma un trozo de [place] seca o galletas sin sal antes de levantarse de la cama por la maana.  Coma 5 o 6 comidas pequeas por da.  Consuma alimentos blandos y secos (arroz, papas asadas). Los alimentos ricos en carbohidratos generalmente ayudan.  No beba lquidos con las comidas. Tome lquidos [employer].  Evite los alimentos muy grasos y condimentados.  Pdale a otra persona que cocine para usted  si [employer] de algn alimento le provoca nuseas o vmitos.  Si tiene ganas de vomitar despus de tomar las vitaminas prenatales, tmelas a la noche o con una colacin.  Tome colaciones de alimentos proteicos (frutos secos, yogur, queso) entre comidas si siente apetito.  Coma gelatina sin azcar de postre.  Una pulsera de acupresin (que se utiliza para Research scientist (life sciences) en viajes) puede ser de [place].  La acupuntura puede ayudarla.  No fume.  Consiga un humidificador para Customer service manager de su casa libre de [place].  Trate de respirar aire fresco. SOLICITE ATENCIN MDICA SI:   Los remedios caseros no funcionan y Media planner.  Se siente mareada o sufre un desmayo.  Pierde peso. SOLICITE ATENCIN MDICA DE INMEDIATO SI:   Tiene nuseas y vmitos de manera persistente y no puede controlarlos.  Pierde el conocimiento (se desmaya). ASEGRESE DE QUE:  Comprende estas instrucciones.  Controlar su afeccin.  Recibir ayuda de inmediato si no mejora o si empeora. Document Released: 01/04/2009 Document Revised: 09/23/2013 [facility] Patient Information 2015 [place], [place]. This information is not intended to replace advice given to you by your health care provider. Make sure you discuss any questions you have with your health care provider.  Las medicinas seguras para tomar Academic librarian  Safe Medications in Pregnancy  Acn:  Benzoyl Peroxide (Perxido de benzolo)  Salicylic Acid (cido saliclico)  Dolor de espalda/Dolor de cabeza:  Tylenol: 2 pastillas de concentracin regular cada 4 horas O 2 pastillas de concentracin fuerte cada 6 horas  Resfriados/Tos/Alergias:  Benadryl (sin alcohol) 25 mg cada 6 horas segn lo necesite Breath Right strips (Tiras para respirar correctamente)  Claritin  Cepacol (pastillas de  chupar para la garganta)  Chloraseptic (aerosol para la garganta)  Cold-Eeze- hasta tres veces por da  Cough drops (pastillas de chupar para la tos, sin  alcohol)  Flonase (con receta mdica solamente)  Guaifenesin  Mucinex  Robitussin DM (simple solamente, sin alcohol)  Saline nasal spray/drops (Aerosol nasal salino/gotas) Sudafed (pseudoephedrine) y  Actifed * utilizar slo despus de 12 semanas de gestacin y si no tiene la presin arterial alta.  Tylenol Vicks  VapoRub  Zinc lozenges (pastillas para la garganta)  Zyrtec  Estreimiento:  Colace  Ducolax (supositorios)  Fleet enema (lavado intestinal rectal)  Glycerin (supositorios)  Metamucil  Milk of magnesia (leche de magnesia)  Miralax  Senokot  Smooth Move (t)  Diarrea:  Kaopectate Imodium A-D  *NO tome Pepto-Bismol  Hemorroides:  Anusol  Anusol HC  Preparation H  Tucks  Indigestin:  Tums  Maalox  Mylanta  Zantac  Pepcid  Insomnia:  Benadryl (sin alcohol)  cada 6 horas segn lo necesite  Tylenol PM  Unisom, no Gelcaps  Calambres en las piernas:  Tums  MagGel Nuseas/Vmitos:  Bonine  Dramamine  Emetrol  Ginger (extracto)  Sea-Bands  Meclizine  Medicina para las nuseas que puede tomar durante el embarazo: Unisom (doxylamine succinate, pastillas de 25 mg) Tome una pastilla al da al [place]. Si los sntomas no estn adecuadamente controlados, la dosis puede aumentarse hasta una dosis mxima recomendada de [employer] al da (1/2 pastilla por la [place], 1/2 pastilla a media tarde y Neomia Dear pastilla al [place]). Pastillas de Vitamina B6 de . Tome [employer] veces al da (hasta 200 mg por da).  Erupciones en la piel:  Productos de Aveeno  Benadryl cream (crema o una dosis de  cada 6 horas segn lo necesite)  Calamine Lotion (locin)  1% cortisone cream (crema de cortisona de 1%)  nfeccin vaginal por hongos (candidiasis):  Gyne-lotrimin 7  Monistat 7   **Si est tomando varias medicinas, por favor revise las etiquetas para Art gallery manager los mismos ingredientes Superior. **Tome la medicina segn lo indicado en la etiqueta. **No  tome ms de 400 mg de Tylenol en 24 horas. **No tome medicinas que contengan aspirina o ibuprofeno.

## 2015-06-17 NOTE — MAU Provider Note (Signed)
History     CSN: [id number]  Arrival date and time: 06/16/15 2304   First Provider Initiated Contact with Patient 06/17/15 0015      Chief Complaint  Patient presents with  . Abdominal Pain   Abdominal Pain This is a new problem. The current episode started more than 1 month ago. The onset quality is gradual. The problem occurs intermittently. The problem has been unchanged. The pain is located in the epigastric region. The pain is at a severity of 5/10. The quality of the pain is burning. Associated symptoms include nausea and vomiting. Pertinent negatives include no constipation, diarrhea, dysuria, fever or frequency. Nothing aggravates the pain. The pain is relieved by nothing. She has tried nothing for the symptoms.  Emesis  This is a new problem. The current episode started more than 1 month ago. The problem occurs more than 10 times per day. The problem has been unchanged. The emesis has an appearance of stomach contents. There has been no fever. Associated symptoms include abdominal pain. Pertinent negatives include no diarrhea or fever. Risk factors: pregnancy  She has tried nothing for the symptoms.    History reviewed. No pertinent past medical history.  History reviewed. No pertinent past surgical history.  History reviewed. No pertinent family history.  Social History  Substance Use Topics  . Smoking status: Never Smoker   . Smokeless tobacco: None  . Alcohol Use: No    Allergies: No Known Allergies  Prescriptions prior to admission  Medication Sig Dispense Refill Last Dose  . diphenhydrAMINE (BENADRYL) 25 MG tablet Take 2 tablets (50 mg total) by mouth every 4 (four) hours as needed for itching. 30 tablet 0     Review of Systems  Constitutional: Negative for fever.  Gastrointestinal: Positive for nausea, vomiting and abdominal pain. Negative for diarrhea and constipation.  Genitourinary: Negative for dysuria, urgency and frequency.   Physical Exam   Blood  pressure 115/61, pulse 77, temperature 98 F (36.7 C), temperature source Oral, resp. rate 18, last menstrual period 03/14/2015.  Physical Exam  Nursing note and vitals reviewed. Constitutional: She is oriented to person, place, and time. She appears well-developed and well-nourished. No distress.  Cardiovascular: Normal rate.   Respiratory: Effort normal.  GI: Soft. There is no tenderness. There is no rebound.  Neurological: She is alert and oriented to person, place, and time.  Skin: Skin is warm and dry.  Psychiatric: She has a normal mood and affect.    Results for orders placed or performed during the hospital encounter of 06/16/15 (from the past 24 hour(s))  Urinalysis, Routine w reflex microscopic (not at [facility])     Status: Abnormal   Collection Time: 06/16/15 11:15 PM  Result Value Ref Range   Color, Urine YELLOW YELLOW   APPearance CLEAR CLEAR   Specific Gravity, Urine 1.025 1.005 - 1.030   pH 6.5 5.0 - 8.0   Glucose, UA NEGATIVE NEGATIVE mg/dL   Hgb urine dipstick NEGATIVE NEGATIVE   Bilirubin Urine NEGATIVE NEGATIVE   Ketones, ur 15 (A) NEGATIVE mg/dL   Protein, ur NEGATIVE NEGATIVE mg/dL   Urobilinogen, UA 0.2 0.0 - 1.0 mg/dL   Nitrite NEGATIVE NEGATIVE   Leukocytes, UA TRACE (A) NEGATIVE  Urine microscopic-add on     Status: Abnormal   Collection Time: 06/16/15 11:15 PM  Result Value Ref Range   Squamous Epithelial / LPF RARE RARE   WBC, UA 3-6 <3 WBC/hpf   RBC / HPF 0-2 <3 RBC/hpf   Bacteria,  UA FEW (A) RARE     MAU Course  Procedures  MDM 0113: Patient has had phenergan and robinul. She reports that she is feeling better at this time.   Assessment and Plan   1. Ptyalism   2. Nausea and vomiting during pregnancy prior to [redacted] weeks gestation    DC home Comfort measures reviewed  2nd Trimester precautions  RX: robinul and phenergan  Return to MAU as needed Start [facility] as soon as possible   Follow-up Information    Schedule an appointment as soon as  possible for a visit with [facility] DE[facility] [place].   Contact information:   [address] [facility] [address] [phone]        Tawnya Crook 06/17/2015, 12:16 AM

## 2015-07-26 LAB — OB RESULTS CONSOLE RPR: RPR: NONREACTIVE

## 2015-07-26 LAB — OB RESULTS CONSOLE HIV ANTIBODY (ROUTINE TESTING): HIV: NONREACTIVE

## 2015-07-26 LAB — OB RESULTS CONSOLE GC/CHLAMYDIA
CHLAMYDIA, DNA PROBE: NEGATIVE
Gonorrhea: NEGATIVE

## 2015-07-26 LAB — OB RESULTS CONSOLE HEPATITIS B SURFACE ANTIGEN: Hepatitis B Surface Ag: NEGATIVE

## 2015-07-26 LAB — OB RESULTS CONSOLE ANTIBODY SCREEN: ANTIBODY SCREEN: NEGATIVE

## 2015-07-26 LAB — OB RESULTS CONSOLE RUBELLA ANTIBODY, IGM: Rubella: IMMUNE

## 2015-07-26 LAB — OB RESULTS CONSOLE ABO/RH: RH Type: POSITIVE

## 2015-10-03 NOTE — L&D Delivery Note (Signed)
Delivery Note At 3:49 PM a viable female was delivered via  (Presentation:vertex ; OA ).  APGAR: 8, 9; weight  .   Placenta status:spont ,via shults .  Cord: 3vc with the following complications:none .  Cord pH: n/a  Anesthesia:  none Episiotomy:  none Lacerations:  none Suture Repair: n/a Est. Blood Loss150 (mL):    Mom to postpartum.  Baby to Couplet care / Skin to Skin.  [name], [name] 12/26/2015, 4:11 PM

## 2015-11-25 LAB — OB RESULTS CONSOLE GC/CHLAMYDIA
Chlamydia: NEGATIVE
GC PROBE AMP, GENITAL: NEGATIVE

## 2015-11-25 LAB — OB RESULTS CONSOLE GBS: STREP GROUP B AG: POSITIVE

## 2015-12-22 ENCOUNTER — Telehealth (HOSPITAL_COMMUNITY): Payer: Self-pay | Admitting: *Deleted

## 2015-12-22 ENCOUNTER — Encounter (HOSPITAL_COMMUNITY): Payer: Self-pay | Admitting: *Deleted

## 2015-12-22 NOTE — Telephone Encounter (Signed)
Preadmission screen Interpreter number [phone]

## 2015-12-26 ENCOUNTER — Inpatient Hospital Stay (HOSPITAL_COMMUNITY)
Admission: AD | Admit: 2015-12-26 | Discharge: 2015-12-28 | DRG: 775 | Disposition: A | Discharge status: Home / Self Care | Payer: Medicaid Other | Source: Ambulatory Visit | Attending: Family Medicine | Admitting: Family Medicine

## 2015-12-26 ENCOUNTER — Ambulatory Visit (HOSPITAL_COMMUNITY): Admission: RE | Admit: 2015-12-26 | Payer: Self-pay | Source: Ambulatory Visit | Admitting: Family Medicine

## 2015-12-26 ENCOUNTER — Encounter (HOSPITAL_COMMUNITY): Payer: Self-pay | Admitting: *Deleted

## 2015-12-26 DIAGNOSIS — F329 Major depressive disorder, single episode, unspecified: Secondary | ICD-10-CM | POA: Diagnosis present

## 2015-12-26 DIAGNOSIS — O480 Post-term pregnancy: Principal | ICD-10-CM | POA: Diagnosis present

## 2015-12-26 DIAGNOSIS — O99344 Other mental disorders complicating childbirth: Secondary | ICD-10-CM | POA: Diagnosis present

## 2015-12-26 DIAGNOSIS — O99824 Streptococcus B carrier state complicating childbirth: Secondary | ICD-10-CM | POA: Diagnosis present

## 2015-12-26 DIAGNOSIS — Z3A41 41 weeks gestation of pregnancy: Secondary | ICD-10-CM

## 2015-12-26 LAB — CBC
HCT: 31.9 % — ABNORMAL LOW (ref 36.0–46.0)
Hemoglobin: 10.8 g/dL — ABNORMAL LOW (ref 12.0–15.0)
MCH: 28.4 pg (ref 26.0–34.0)
MCHC: 33.9 g/dL (ref 30.0–36.0)
MCV: 83.9 fL (ref 78.0–100.0)
PLATELETS: 162 10*3/uL (ref 150–400)
RBC: 3.8 MIL/uL — AB (ref 3.87–5.11)
RDW: 16.8 % — ABNORMAL HIGH (ref 11.5–15.5)
WBC: 5.8 10*3/uL (ref 4.0–10.5)

## 2015-12-26 LAB — TYPE AND SCREEN
ABO/RH(D): O POS
Antibody Screen: NEGATIVE

## 2015-12-26 LAB — ABO/RH: ABO/RH(D): O POS

## 2015-12-26 MED ORDER — ZOLPIDEM TARTRATE 5 MG PO TABS: 5.0000 mg | ORAL_TABLET | Freq: Every evening | ORAL | Status: DC | PRN

## 2015-12-26 MED ORDER — SODIUM CHLORIDE 0.9% FLUSH
3.0000 mL | Freq: Two times a day (BID) | INTRAVENOUS | Status: DC
  Administered 2015-12-26: 3 mL via INTRAVENOUS

## 2015-12-26 MED ORDER — WITCH HAZEL-GLYCERIN EX PADS: 1.0000 "application " | MEDICATED_PAD | CUTANEOUS | Status: DC | PRN

## 2015-12-26 MED ORDER — CITRIC ACID-SODIUM CITRATE 334-500 MG/5ML PO SOLN: 30.0000 mL | ORAL | Status: DC | PRN

## 2015-12-26 MED ORDER — SIMETHICONE 80 MG PO CHEW: 80.0000 mg | CHEWABLE_TABLET | ORAL | Status: DC | PRN

## 2015-12-26 MED ORDER — LACTATED RINGERS IV SOLN
1.0000 m[IU]/min | INTRAVENOUS | Status: DC
  Administered 2015-12-26: 2 m[IU]/min via INTRAVENOUS
  Filled 2015-12-26: qty 10

## 2015-12-26 MED ORDER — MISOPROSTOL 25 MCG QUARTER TABLET: 25.0000 ug | ORAL_TABLET | ORAL | Status: DC | PRN

## 2015-12-26 MED ORDER — PENICILLIN G POTASSIUM 5000000 UNITS IJ SOLR
2.5000 10*6.[IU] | INTRAVENOUS | Status: DC
  Administered 2015-12-26: 2.5 10*6.[IU] via INTRAVENOUS
  Filled 2015-12-26 (×4): qty 2.5

## 2015-12-26 MED ORDER — ONDANSETRON HCL 4 MG/2ML IJ SOLN: 4.0000 mg | INTRAMUSCULAR | Status: DC | PRN

## 2015-12-26 MED ORDER — MEASLES, MUMPS & RUBELLA VAC ~~LOC~~ INJ
0.5000 mL | INJECTION | Freq: Once | SUBCUTANEOUS | Status: DC
  Filled 2015-12-26: qty 0.5

## 2015-12-26 MED ORDER — OXYCODONE-ACETAMINOPHEN 5-325 MG PO TABS: 2.0000 | ORAL_TABLET | ORAL | Status: DC | PRN

## 2015-12-26 MED ORDER — OXYTOCIN BOLUS FROM INFUSION
500.0000 mL | INTRAVENOUS | Status: DC
  Administered 2015-12-26: 500 mL via INTRAVENOUS

## 2015-12-26 MED ORDER — SODIUM CHLORIDE 0.9 % IV SOLN: 250.0000 mL | INTRAVENOUS | Status: DC | PRN

## 2015-12-26 MED ORDER — IBUPROFEN 600 MG PO TABS
600.0000 mg | ORAL_TABLET | Freq: Four times a day (QID) | ORAL | Status: DC
Start: 2015-12-26 — End: 2015-12-28
  Administered 2015-12-26 – 2015-12-28 (×6): 600 mg via ORAL
  Filled 2015-12-26 (×7): qty 1

## 2015-12-26 MED ORDER — PENICILLIN G POTASSIUM 5000000 UNITS IJ SOLR
5.0000 10*6.[IU] | Freq: Once | INTRAMUSCULAR | Status: AC
  Administered 2015-12-26: 5 10*6.[IU] via INTRAVENOUS
  Filled 2015-12-26: qty 5

## 2015-12-26 MED ORDER — FENTANYL CITRATE (PF) 100 MCG/2ML IJ SOLN
50.0000 ug | INTRAMUSCULAR | Status: DC | PRN
  Administered 2015-12-26: 50 ug via INTRAVENOUS
  Filled 2015-12-26: qty 2

## 2015-12-26 MED ORDER — OXYTOCIN 10 UNIT/ML IJ SOLN: 10.0000 [IU] | Freq: Once | INTRAMUSCULAR | Status: DC

## 2015-12-26 MED ORDER — BENZOCAINE-MENTHOL 20-0.5 % EX AERO: 1.0000 "application " | INHALATION_SPRAY | CUTANEOUS | Status: DC | PRN

## 2015-12-26 MED ORDER — HYDROXYZINE HCL 50 MG PO TABS: 50.0000 mg | ORAL_TABLET | Freq: Four times a day (QID) | ORAL | Status: DC | PRN

## 2015-12-26 MED ORDER — ACETAMINOPHEN 325 MG PO TABS: 650.0000 mg | ORAL_TABLET | ORAL | Status: DC | PRN

## 2015-12-26 MED ORDER — PRENATAL MULTIVITAMIN CH
1.0000 | ORAL_TABLET | Freq: Every day | ORAL | Status: DC
  Filled 2015-12-26: qty 1

## 2015-12-26 MED ORDER — VITAMIN K1 1 MG/0.5ML IJ SOLN
INTRAMUSCULAR | Status: AC
  Filled 2015-12-26: qty 0.5

## 2015-12-26 MED ORDER — SODIUM CHLORIDE 0.9% FLUSH: 3.0000 mL | Freq: Two times a day (BID) | INTRAVENOUS | Status: DC

## 2015-12-26 MED ORDER — LACTATED RINGERS IV SOLN
2.5000 [IU]/h | INTRAVENOUS | Status: DC
  Administered 2015-12-26: 2.5 [IU]/h via INTRAVENOUS

## 2015-12-26 MED ORDER — SENNOSIDES-DOCUSATE SODIUM 8.6-50 MG PO TABS
2.0000 | ORAL_TABLET | ORAL | Status: DC
  Administered 2015-12-27 – 2015-12-28 (×2): 2 via ORAL
  Filled 2015-12-26 (×2): qty 2

## 2015-12-26 MED ORDER — OXYCODONE-ACETAMINOPHEN 5-325 MG PO TABS
1.0000 | ORAL_TABLET | ORAL | Status: DC | PRN
Start: 2015-12-26 — End: 2015-12-28

## 2015-12-26 MED ORDER — ONDANSETRON HCL 4 MG/2ML IJ SOLN: 4.0000 mg | Freq: Four times a day (QID) | INTRAMUSCULAR | Status: DC | PRN

## 2015-12-26 MED ORDER — SODIUM CHLORIDE 0.9% FLUSH: 3.0000 mL | INTRAVENOUS | Status: DC | PRN

## 2015-12-26 MED ORDER — DIPHENHYDRAMINE HCL 25 MG PO CAPS: 25.0000 mg | ORAL_CAPSULE | Freq: Four times a day (QID) | ORAL | Status: DC | PRN

## 2015-12-26 MED ORDER — ONDANSETRON HCL 4 MG PO TABS
4.0000 mg | ORAL_TABLET | ORAL | Status: DC | PRN
Start: 2015-12-26 — End: 2015-12-28

## 2015-12-26 MED ORDER — LANOLIN HYDROUS EX OINT: TOPICAL_OINTMENT | CUTANEOUS | Status: DC | PRN

## 2015-12-26 MED ORDER — LACTATED RINGERS IV SOLN: 500.0000 mL | INTRAVENOUS | Status: DC | PRN

## 2015-12-26 MED ORDER — DIBUCAINE 1 % RE OINT: 1.0000 "application " | TOPICAL_OINTMENT | RECTAL | Status: DC | PRN

## 2015-12-26 MED ORDER — LIDOCAINE HCL (PF) 1 % IJ SOLN: 30.0000 mL | INTRAMUSCULAR | Status: DC | PRN

## 2015-12-26 MED ORDER — TERBUTALINE SULFATE 1 MG/ML IJ SOLN: 0.2500 mg | Freq: Once | INTRAMUSCULAR | Status: DC | PRN

## 2015-12-26 MED ORDER — LACTATED RINGERS IV SOLN
INTRAVENOUS | Status: DC
  Administered 2015-12-26: 09:00:00 via INTRAVENOUS

## 2015-12-26 MED ORDER — TETANUS-DIPHTH-ACELL PERTUSSIS 5-2.5-18.5 LF-MCG/0.5 IM SUSP: 0.5000 mL | Freq: Once | INTRAMUSCULAR | Status: DC
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
## 2015-12-26 NOTE — Progress Notes (Signed)
Assisted RN and MD with interpretation of medical information Spanish Interpreter

## 2015-12-26 NOTE — Progress Notes (Signed)
Checked on patients needs.  °Spanish Interpreter  °

## 2015-12-26 NOTE — Lactation Note (Signed)
This note was copied from a baby's chart. Lactation Consultation Note  Benita Spanish interpreter present. P4, Ex BF Breastfed first 2 children for 1 year and a few days with 3rd child. Reviewed hand expression with mother and mother stated "I have no milk". Discussed with mother how breastmilk volume changes, the size of baby's stomach and supply and demand. Assisted w/ latching in laid back position.  Rhythmical sucks and swallows observed for more than 15 min. Encouraged feeding on demand on both breasts. Reviewed basics.  Provided mother with hand pump. Mom encouraged to feed baby 8-12 times/24 hours and with feeding cues.  Mom made aware of O/P services, breastfeeding support groups, community resources, and our phone # for post-discharge questions in Spanish.      Patient Name: Erin Parker UJWJX'BToday's Date: 12/26/2015 Reason for consult: Initial assessment   Maternal Data Has patient been taught Hand Expression?: Yes Does the patient have breastfeeding experience prior to this delivery?: Yes  Feeding Feeding Type: Breast Fed Length of feed: 10 min  LATCH Score/Interventions Latch: Grasps breast easily, tongue down, lips flanged, rhythmical sucking.  Audible Swallowing: Spontaneous and intermittent  Type of Nipple: Everted at rest and after stimulation  Comfort (Breast/Nipple): Soft / non-tender     Hold (Positioning): Assistance needed to correctly position infant at breast and maintain latch.  LATCH Score: 9  Lactation Tools Discussed/Used     Consult Status Consult Status: Follow-up Date: 12/27/15 Follow-up type: In-patient    Dahlia ByesBerkelhammer, Ruth [facility] 12/26/2015, 8:05 PM

## 2015-12-26 NOTE — Progress Notes (Signed)
Assisted Lactation with interpretation of medical information and breast feeding instructions. °Spanish Interpreter  °

## 2015-12-27 LAB — RPR: RPR Ser Ql: NONREACTIVE

## 2015-12-27 MED ORDER — IBUPROFEN 600 MG PO TABS: 600.0000 mg | ORAL_TABLET | Freq: Four times a day (QID) | ORAL | Status: DC

## 2015-12-27 NOTE — Discharge Summary (Signed)
OB Discharge Summary  Patient Name: Erin Parker DOB: 03-06-1981 MRN: [id number]  Date of admission: 12/26/2015 Delivering MD: Zerita Boers   Date of discharge: 12/27/2015  Admitting diagnosis: 40.6wks,induction Intrauterine pregnancy: [redacted]w[redacted]d     Secondary diagnosis:Active Problems:   Post term pregnancy  Additional problems:none     Discharge diagnosis: Term Pregnancy Delivered                                                                     Post partum procedures:none  Augmentation: Pitocin  Complications: None  Hospital course:  Onset of Labor With Vaginal Delivery     35 y.o. yo [id number] at [redacted]w[redacted]d was admitted in Active Labor on 12/26/2015. Patient had an uncomplicated labor course as follows:  Membrane Rupture Time/Date: 2:10 PM ,12/26/2015   Intrapartum Procedures: Episiotomy: None [1]                                         Lacerations:  None [1]  Patient had a delivery of a Viable infant. 12/26/2015  Information for the patient's newborn:  Simmie Davies, Girl [place] [[id number]]  Delivery Method: Vaginal, Spontaneous Delivery (Filed from Delivery Summary)    Pateint had an uncomplicated postpartum course.  She is ambulating, tolerating a regular diet, passing flatus, and urinating well. Patient is discharged home in stable condition on 12/27/2015.    Physical exam  Filed Vitals:   12/26/15 1730 12/26/15 1830 12/26/15 2215 12/27/15 0609  BP: 115/73 121/75 108/62 102/60  Pulse: 72 84 81 77  Temp: 98.4 F (36.9 C) 99.4 F (37.4 C) 98.7 F (37.1 C) 98.3 F (36.8 C)  TempSrc: Oral Oral Oral Oral  Resp: Height:      Weight:      SpO2: 100% 100% 98% 98%   General: alert, cooperative and no distress Lochia: appropriate Uterine Fundus: firm Incision: N/A DVT Evaluation: No evidence of DVT seen on physical exam. Negative Homan's sign. No cords or calf tenderness. Labs: Lab Results  Component Value Date   WBC 5.8  12/26/2015   HGB 10.8* 12/26/2015   HCT 31.9* 12/26/2015   MCV 83.9 12/26/2015   PLT 162 12/26/2015   CMP Latest Ref Rng 03/23/2010  Glucose 70 - 99 mg/dL 130(Q)  BUN 6 - 23 mg/dL 6  Creatinine 0.4 - 1.2 mg/dL 6.57  Sodium 846 - 962 mEq/L 134(L)  Potassium 3.5 - 5.1 mEq/L 3.4(L)  Chloride 96 - 112 mEq/L 108  CO2 19 - 32 mEq/L 19  Calcium 8.4 - 10.5 mg/dL 8.2(L)  Total Protein 6.0 - 8.3 g/dL 9.5(M)  Total Bilirubin 0.3 - 1.2 mg/dL 0.4  Alkaline Phos 39 - 117 U/L 163(H)  AST 0 - 37 U/L 23  ALT 0 - 35 U/L 10    Discharge instruction: per After Visit Summary and "Baby and Me Booklet".  After Visit Meds:    Medication List    ASK your doctor about these medications        glycopyrrolate 1 MG tablet  Commonly known as:  ROBINUL  Take 1 tablet (1 mg total) by  mouth 3 (three) times daily as needed.     prenatal multivitamin Tabs tablet  Take 1 tablet by mouth daily at 12 noon.     promethazine 25 MG tablet  Commonly known as:  PHENERGAN  Take 0.5-1 tablets (12.5-25 mg total) by mouth every 6 (six) hours as needed.        Diet: routine diet  Activity: Advance as tolerated. Pelvic rest for 6 weeks.   Outpatient follow up:6 weeks Follow up Appt:No future appointments. Follow up visit: No Follow-up on file.  Postpartum contraception: IUD Mirena  Newborn Data: Live born female  Birth Weight: 7 lb 13.2 oz (3550 g) APGAR: 8, 9  Baby Feeding: Bottle and Breast Disposition:home with mother   12/27/2015 Ferdie PingLAWSON, Vanderbilt Ranieri DARLENE, CNM

## 2015-12-27 NOTE — Lactation Note (Signed)
This note was copied from a baby's chart. Lactation Consultation Note  Patient Name: Erin Parker ZOXWR'UToday's Date: 12/27/2015 Reason for consult: Follow-up assessment;Other (Comment) (per [employer] [facility]( [facility] interpreter unable to assist at this time and recommended to call interpreter line ) )  [name] [phone] on the line  Baby is 21 hours old. Baby has exclusively breast fed since birth with consistency. Baby just finished feeding 20 mins , crying, LC changed wet diaper.  LC placed baby skin to skin and mom independently latched the baby on the left breast with depth. Multiply swallows noted. LC had mom do breast compressions  Intermittently  and increased swallows noted. Sore nipple and engorgement prevention and tx reviewed. Per mom already has a hand pump. Per mom active with [facility].  Mother informed of post-discharge support and given phone number to the lactation department, including services for phone call assistance; out-patient appointments; and breastfeeding support group. List of other breastfeeding resources in the community given in the handout. Encouraged mother to call for problems or concerns related to breastfeeding.  Maternal Data Has patient been taught Hand Expression?: Yes  Feeding Feeding Type: Breast Fed Length of feed: 20 min  LATCH Score/Interventions Latch: Grasps breast easily, tongue down, lips flanged, rhythmical sucking.  Audible Swallowing: Spontaneous and intermittent  Type of Nipple: Everted at rest and after stimulation  Comfort (Breast/Nipple): Soft / non-tender     Hold (Positioning): No assistance needed to correctly position infant at breast. Intervention(s): Breastfeeding basics reviewed;Support Pillows;Position options;Skin to skin  LATCH Score: 10  Lactation Tools Discussed/Used Tools: Pump (per dad spoke up and said in [place] mom already has a pump ) Breast pump type:  (RN gave her a hand pump ) WIC  Program: Yes ([facility] [name]   Consult Status Consult Status: Complete Date: 12/27/15 Follow-up type: In-patient    Kathrin Greathouseorio, Ishi Danser Ann 12/27/2015, 12:53 PM

## 2015-12-27 NOTE — Progress Notes (Signed)
[facility] Algernon Huxleyattray while he explained the plan of care for the infant.  Interpreter also used by this RN to explain visitation to NICU, pumping/feeding while infant in NICU, and staying on Mother Baby an additional night before being discharged tomorrow; also explained she will still receive her meals tonight and in the morning.  Spanish interpreter used as well to explain that the Mother Baby RN will take her to visit her baby when NICU calls to say they are ready (lumbar puncture planned to be done on transfer to NICU, explained by Dr. Algernon Huxleyattray and consent received using Spanish Interpreter and witnessed by this RN).

## 2015-12-27 NOTE — Progress Notes (Signed)
UR chart review completed.  
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
## 2015-12-27 NOTE — Progress Notes (Signed)
Ordered pt meals, by Viria Alvarez Spanish Interpreter. 

## 2015-12-27 NOTE — Progress Notes (Signed)
Patient did not want me to order her lunch. Eda H Royal Interpreter °

## 2015-12-28 NOTE — Progress Notes (Signed)
Pt declines Tdap and flu vaccine stating that she received them with her prenatal care. Pt discharged ambulatory to see her baby still in NICU.

## 2015-12-28 NOTE — Progress Notes (Signed)
Patient's infant is in NICU. Spanish interpreter in to explain how to use the double electric pump every 3 hours for 15-20 min while awake. Breastmilk labels with infant's name given with instuctions. Patient verbalized understanding

## 2015-12-28 NOTE — Discharge Summary (Signed)
OB Discharge Summary  Patient Name: Erin Parker DOB: [birth year] MRN: [id number]  Date of admission: 12/26/2015 Delivering MD: Zerita Boers   Date of discharge: 12/28/2015  Admitting diagnosis: 40.6wks,induction Intrauterine pregnancy: [redacted]w[redacted]d  Secondary diagnosis:Active Problems:  Post term pregnancy  Additional problems:none  Discharge diagnosis: Term Pregnancy Delivered    Post partum procedures:none  Augmentation: Pitocin  Complications: None  Hospital course: Onset of Labor With Vaginal Delivery 35 y.o. yo [id number] at [redacted]w[redacted]d was admitted in Active Labor on 12/26/2015. Patient had an uncomplicated labor course as follows:  Membrane Rupture Time/Date: 2:10 PM ,12/26/2015   Intrapartum Procedures: Episiotomy: None [1]   Lacerations: None [1]  Patient had a delivery of a Viable infant. 12/26/2015  Information for the patient's newborn:  Simmie Davies, Girl [place] [[id number]]  Delivery Method: Vaginal, Spontaneous Delivery (Filed from Delivery Summary)    Pateint had an uncomplicated postpartum course. She is ambulating, tolerating a regular diet, passing flatus, and urinating well. Patient is discharged home in stable condition on 12/28/2015.  Physical exam  Filed Vitals:   12/26/15 1730 12/26/15 1830 12/26/15 2215 12/27/15 0609  BP: 115/73 121/75 108/62 102/60  Pulse: 72 84 81 77  Temp: 98.4 F (36.9 C) 99.4 F (37.4 C) 98.7 F (37.1 C) 98.3 F (36.8 C)  TempSrc: Oral Oral Oral Oral  Resp: Height:      Weight:      SpO2: 100% 100% 98% 98%   General: alert, cooperative and no distress Lochia: appropriate Uterine Fundus: firm Incision: N/A DVT Evaluation: No evidence of DVT seen on  physical exam. Negative Homan's sign. No cords or calf tenderness. Labs:  Recent Labs    Lab Results  Component Value Date   WBC 5.8 12/26/2015   HGB 10.8* 12/26/2015   HCT 31.9* 12/26/2015   MCV 83.9 12/26/2015   PLT 162 12/26/2015     CMP Latest Ref Rng 03/23/2010  Glucose 70 - 99 mg/dL 130(Q)  BUN 6 - 23 mg/dL 6  Creatinine 0.4 - 1.2 mg/dL 6.57  Sodium 846 - 962 mEq/L 134(L)  Potassium 3.5 - 5.1 mEq/L 3.4(L)  Chloride 96 - 112 mEq/L 108  CO2 19 - 32 mEq/L 19  Calcium 8.4 - 10.5 mg/dL 8.2(L)  Total Protein 6.0 - 8.3 g/dL 9.5(M)  Total Bilirubin 0.3 - 1.2 mg/dL 0.4  Alkaline Phos 39 - 117 U/L 163(H)  AST 0 - 37 U/L 23  ALT 0 - 35 U/L 10    Discharge instruction: per After Visit Summary and "Baby and Me Booklet".  After Visit Meds:    Medication List    ASK your doctor about these medications       glycopyrrolate 1 MG tablet  Commonly known as: ROBINUL  Take 1 tablet (1 mg total) by mouth 3 (three) times daily as needed.     prenatal multivitamin Tabs tablet  Take 1 tablet by mouth daily at 12 noon.     promethazine 25 MG tablet  Commonly known as: PHENERGAN  Take 0.5-1 tablets (12.5-25 mg total) by mouth every 6 (six) hours as needed.        Diet: routine diet  Activity: Advance as tolerated. Pelvic rest for 6 weeks.   Outpatient follow up:6 weeks Follow up Appt:No future appointments. Follow up visit: No Follow-up on file.  Postpartum contraception: Nexplanon  Newborn Data: Live born female  Birth Weight: 7 lb 13.2 oz (3550 g) APGAR: 8, 9  Baby Feeding: Bottle and  Breast Disposition: NICU       Caryl AdaJazma Phelps, DO 12/28/2015, 8:06 AM PGY-2, [facility]    APP attestation:  I have seen and examined this patient; I agree with above documentation in the Resident's note.   Doreen Salvagemilia Simmie DaviesMelquiadez Valois is a 35 y.o. [id number]   PE: BP 109/83  mmHg  Pulse 91  Temp(Src) 98.3 F (36.8 C) (Oral)  Resp 17  Ht 5' 4.96" (1.65 m)  Wt 167 lb (75.751 kg)  BMI 27.82 kg/m2  SpO2 99%  LMP 03/14/2015  Breastfeeding? Unknown Gen: calm comfortable, NAD Resp: normal effort, no distress Abd: gravid  ROS, labs, PMH reviewed  Plan: Discharge   Rick Carruthers Grissett 12/29/2015, 9:12 AM

## 2015-12-28 NOTE — Discharge Instructions (Signed)
Parto vaginal, Cuidados posteriores  °(Vaginal Delivery, Care After) °Siga estas instrucciones durante las próximas semanas. Estas indicaciones para el alta le proporcionan información general acerca de cómo deberá cuidarse después del parto. El médico también podrá darle instrucciones específicas. El tratamiento ha sido planificado según las prácticas médicas actuales, pero en algunos casos pueden ocurrir problemas. Comuníquese con el médico si tiene algún problema o tiene preguntas al volver a su casa.  °INSTRUCCIONES PARA EL CUIDADO EN EL HOGAR  °· Tome sólo medicamentos de venta libre o recetados, según las indicaciones del médico o del farmacéutico. °· No beba alcohol, especialmente si está amamantando o toma analgésicos. °· No mastique tabaco ni fume. °· No consuma drogas. °· Continúe con un adecuado cuidado perineal. El buen cuidado perineal incluye: °¨ Higienizarse de adelante hacia atrás. °¨ Mantener la zona perineal limpia. °· No use tampones ni duchas vaginales hasta que su médico la autorice. °· Dúchese, lávese el cabello y tome baños de inmersión según las indicaciones de su médico. °· Utilice un sostén que le ajuste bien y que brinde buen soporte a sus mamas. °· Consuma alimentos saludables. °· Beba suficiente líquido para mantener la orina clara o de color amarillo pálido. °· Consuma alimentos ricos en fibra como cereales y panes integrales, arroz, frijoles y frutas y verduras frescas todos los días. Estos alimentos pueden ayudarla a prevenir o aliviar el estreñimiento. °· Siga las recomendaciones de su médico relacionadas con la reanudación de actividades como subir escaleras, conducir automóviles, levantar objetos, hacer ejercicios o viajar. °· Hable con su médico acerca de reanudar la actividad sexual. Volver a la actividad sexual depende del riesgo de infección, la velocidad de la curación y la comodidad y su deseo de reanudarla. °· Trate de que alguien la ayude con las actividades del hogar y con  el recién nacido al menos durante un par de días después de salir del hospital. °· Descanse todo lo que pueda. Trate de descansar o tomar una siesta mientras el bebé está durmiendo. °· Aumente sus actividades gradualmente. °· Cumpla con todas las visitas de control programadas para después del parto. Es muy importante asistir a todas las citas programadas de seguimiento. En estas citas, su médico va a controlarla para asegurarse de que esté sanando física y emocionalmente. °SOLICITE ATENCIÓN MÉDICA SI:  °· Elimina coágulos grandes por la vagina. Guarde algunos coágulos para mostrarle al médico. °· Tiene una secreción con feo olor que proviene de la vagina. °· Tiene dificultad para orinar. °· Orina con frecuencia. °· Siente dolor al orinar. °· Nota un cambio en sus movimientos intestinales. °· Aumenta el enrojecimiento, el dolor o la hinchazón en la zona de la incisión vaginal (episiotomía) o el desgarro vaginal. °· Tiene pus que drena por la episiotomía o el desgarro vaginal. °· La episiotomía o el desgarro vaginal se abren. °· Sus mamas le duelen, están duras o enrojecidas. °· Sufre un dolor intenso de cabeza. °· Tiene visión borrosa o ve manchas. °· Se siente triste o deprimida. °· Tiene pensamientos acerca de lastimarse o dañar al recién nacido. °· Tiene preguntas acerca de su cuidado personal, el cuidado del recién nacido o acerca de los medicamentos. °· Se siente mareada o sufre un desmayo. °· Tiene una erupción. °· Tiene náuseas o vómitos. °· Usted amamantó al bebé y no ha tenido su período menstrual dentro de las 12 semanas después de dejar de amamantar. °· No amamanta al bebé y no tuvo su período menstrual en las últimas 12° semanas después del   parto. °· Tiene fiebre. °SOLICITE ATENCIÓN MÉDICA DE INMEDIATO SI:  °· Siente dolor persistente. °· Siente dolor en el pecho. °· Le falta el aire. °· Se desmaya. °· Siente dolor en la pierna. °· Siente dolor en el estómago. °· El sangrado vaginal satura dos o más  apósitos en 1 hora. °  °Esta información no tiene como fin reemplazar el consejo del médico. Asegúrese de hacerle al médico cualquier pregunta que tenga. °  °Document Released: 09/18/2005 Document Revised: 06/09/2015 °Elsevier Interactive Patient Education ©2016 Elsevier Inc. ° °

## 2015-12-30 ENCOUNTER — Ambulatory Visit: Payer: Self-pay

## 2015-12-30 NOTE — Lactation Note (Signed)
This note was copied from a baby's chart. Lactation Consultation Note  Patient Name: Erin Parker JXBJY'NToday's Date: 12/30/2015 Reason for consult: Follow-up assessment;NICU baby   [facility] with mom [facility]. Infant is to be d/c home today. MOm is experienced BF mom.  Mom denies questions in regards to BF at this time and has been BF infant in NICU. She is pumping and milk is in. She has a DEBP at home for use. Mom to call prn.    Maternal Data    Feeding Feeding Type: Breast Milk Nipple Type: Slow - flow Length of feed: 15 min  LATCH Score/Interventions                      Lactation Tools Discussed/Used     Consult Status Consult Status: Complete Follow-up type: Call as needed    Ed BlalockSharon S Brentyn Seehafer 12/30/2015, 11:15 AM

## 2017-08-16 ENCOUNTER — Other Ambulatory Visit: Payer: Self-pay

## 2017-08-16 ENCOUNTER — Encounter (HOSPITAL_COMMUNITY): Payer: Self-pay

## 2017-08-16 DIAGNOSIS — K8020 Calculus of gallbladder without cholecystitis without obstruction: Secondary | ICD-10-CM | POA: Insufficient documentation

## 2017-08-16 DIAGNOSIS — R740 Nonspecific elevation of levels of transaminase and lactic acid dehydrogenase [LDH]: Secondary | ICD-10-CM | POA: Insufficient documentation

## 2017-08-16 LAB — URINALYSIS, ROUTINE W REFLEX MICROSCOPIC
BACTERIA UA: NONE SEEN
BILIRUBIN URINE: NEGATIVE
Glucose, UA: NEGATIVE mg/dL
Hgb urine dipstick: NEGATIVE
Ketones, ur: 20 mg/dL — AB
LEUKOCYTES UA: NEGATIVE
NITRITE: NEGATIVE
PH: 8 (ref 5.0–8.0)
Protein, ur: 100 mg/dL — AB
SPECIFIC GRAVITY, URINE: 1.025 (ref 1.005–1.030)

## 2017-08-16 LAB — CBC
HEMATOCRIT: 39.2 % (ref 36.0–46.0)
Hemoglobin: 13.6 g/dL (ref 12.0–15.0)
MCH: 31.7 pg (ref 26.0–34.0)
MCHC: 34.7 g/dL (ref 30.0–36.0)
MCV: 91.4 fL (ref 78.0–100.0)
PLATELETS: 189 10*3/uL (ref 150–400)
RBC: 4.29 MIL/uL (ref 3.87–5.11)
RDW: 13.6 % (ref 11.5–15.5)
WBC: 8.4 10*3/uL (ref 4.0–10.5)

## 2017-08-16 LAB — COMPREHENSIVE METABOLIC PANEL
ALBUMIN: 4.3 g/dL (ref 3.5–5.0)
ALT: 258 U/L — AB (ref 14–54)
AST: 421 U/L — AB (ref 15–41)
Alkaline Phosphatase: 76 U/L (ref 38–126)
Anion gap: 7 (ref 5–15)
BILIRUBIN TOTAL: 1.2 mg/dL (ref 0.3–1.2)
BUN: 8 mg/dL (ref 6–20)
CO2: 23 mmol/L (ref 22–32)
CREATININE: 0.59 mg/dL (ref 0.44–1.00)
Calcium: 8.8 mg/dL — ABNORMAL LOW (ref 8.9–10.3)
Chloride: 105 mmol/L (ref 101–111)
GFR calc Af Amer: >60 mL/min (ref >60)
GLUCOSE: 161 mg/dL — AB (ref 65–99)
Potassium: 3.6 mmol/L (ref 3.5–5.1)
Sodium: 135 mmol/L (ref 135–145)
TOTAL PROTEIN: 7 g/dL (ref 6.5–8.1)

## 2017-08-16 LAB — I-STAT BETA HCG BLOOD, ED (MC, WL, AP ONLY)

## 2017-08-16 LAB — LIPASE, BLOOD: Lipase: 22 U/L (ref 11–51)

## 2017-08-16 NOTE — ED Triage Notes (Signed)
Pt endorses generalized abd pain with nausea that began today, denies vomiting diarrhea. Denies urinary sx. VSS.

## 2017-08-17 ENCOUNTER — Emergency Department (HOSPITAL_COMMUNITY): Payer: Self-pay

## 2017-08-17 ENCOUNTER — Emergency Department (HOSPITAL_COMMUNITY)
Admission: EM | Admit: 2017-08-17 | Discharge: 2017-08-17 | Disposition: A | Discharge status: Home / Self Care | Payer: Self-pay | Attending: Emergency Medicine | Admitting: Emergency Medicine

## 2017-08-17 DIAGNOSIS — K8021 Calculus of gallbladder without cholecystitis with obstruction: Secondary | ICD-10-CM

## 2017-08-17 NOTE — ED Notes (Signed)
Pt departed in NAD, refused use of wheelchair.  
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
## 2017-08-17 NOTE — Discharge Instructions (Signed)
Return to the emergency department if you have a high fever, uncontrolled vomiting or severe pain. Otherwise, follow up with the surgeon to discuss having your gall bladder removed.

## 2017-09-26 ENCOUNTER — Encounter: Payer: Self-pay | Admitting: Family Medicine

## 2017-09-26 ENCOUNTER — Ambulatory Visit: Payer: Self-pay | Attending: Family Medicine | Admitting: Family Medicine

## 2017-09-26 ENCOUNTER — Encounter: Payer: Self-pay | Admitting: Gastroenterology

## 2017-09-26 VITALS — BP 114/70 | HR 61 | Temp 98.4°F | Wt 138.8 lb

## 2017-09-26 DIAGNOSIS — Z0000 Encounter for general adult medical examination without abnormal findings: Secondary | ICD-10-CM

## 2017-09-26 DIAGNOSIS — E785 Hyperlipidemia, unspecified: Secondary | ICD-10-CM

## 2017-09-26 DIAGNOSIS — Z23 Encounter for immunization: Secondary | ICD-10-CM

## 2017-09-26 DIAGNOSIS — Z79899 Other long term (current) drug therapy: Secondary | ICD-10-CM | POA: Insufficient documentation

## 2017-09-26 DIAGNOSIS — Z791 Long term (current) use of non-steroidal anti-inflammatories (NSAID): Secondary | ICD-10-CM | POA: Insufficient documentation

## 2017-09-26 DIAGNOSIS — K8020 Calculus of gallbladder without cholecystitis without obstruction: Secondary | ICD-10-CM | POA: Insufficient documentation

## 2017-09-26 DIAGNOSIS — Z8719 Personal history of other diseases of the digestive system: Secondary | ICD-10-CM

## 2017-09-26 DIAGNOSIS — R10816 Epigastric abdominal tenderness: Secondary | ICD-10-CM

## 2017-09-26 NOTE — Patient Instructions (Signed)
Colelitiasis (Cholelithiasis) La colelitiasis (tambin llamada clculos en la vescula) es una enfermedad de la vescula. La vescula es un rgano pequeo que ayuda a digerir las grasas. Los sntomas de clculos en la vescula son:  Ganas de vomitar (nuseas).  Devolver la comida (vomitar).  Dolor en el vientre.  Piel amarilla (ictericia)  Dolor sbito. Podr sentir el dolor durante algunos minutos o unas horas.  Fiebre.  Dolor a la palpacin. CUIDADOS EN EL HOGAR  Tome slo los medicamentos segn le haya indicado el mdico.  Siga una dieta baja en grasas hasta que vuelva a ver al mdico nuevamente. Consumir grasas puede causar dolor.  Concurra a las consultas de control con el mdico, segn las indicaciones. Los ataques generalmente ocurren repetidas veces. Generalmente se requiere de una ciruga como tratamiento permanente.  SOLICITE AYUDA DE INMEDIATO SI:  El dolor empeora.  El dolor no se alivia con los medicamentos.  Tiene fiebre o sntomas que persisten durante ms de 2-3 das.  Tiene fiebre y los sntomas empeoran repentinamente.  Siente nuseas y vomita.  ASEGRESE DE QUE:  Comprende estas instrucciones.  Controlar su afeccin.  Recibir ayuda de inmediato si no mejora o si empeora.  Esta informacin no tiene como fin reemplazar el consejo del mdico. Asegrese de hacerle al mdico cualquier pregunta que tenga. Document Released: 12/15/2008 Document Revised: 05/21/2013 Document Reviewed: 03/12/2013 Elsevier Interactive Patient Education  2017 Elsevier Inc. Dieta con bajo contenido de grasas para la pancreatitis o los trastornos de la vescula (Low-Fat Diet for Pancreatitis or Gallbladder Conditions) Una dieta con bajo contenido de grasas puede ser til si usted tiene pancreatitis o trastornos de la vescula. En estos trastornos, el pncreas y la vescula tienen dificultades para digerir las grasas. Un plan de alimentacin saludable con menos grasas ayudar  a que el pncreas y la vescula descansen, y reducir los sntomas. QU DEBO SABER ACERCA DE ESTA DIETA?  Consuma una dieta con bajo contenido de grasas. ? Reduzca el consumo de grasas a menos del 20% al 30% del total de caloras diarias. Esto representa menos de 50a 60gramos de grasas por da. ? Recuerde que la dieta debe incluir algo de grasa. Consulte al nutricionista cul debe ser su meta diaria. ? Elija alimentos saludables sin grasas o con bajo contenido de grasas. Busque las palabras "sin grasa", "bajo en grasas" o "bajo contenido de grasas". ? Como gua, mire la etiqueta y elija alimentos con menos de 3gramos de grasa por porcin. Coma solo una porcin.  Evite el alcohol.  No fumar. Si necesita ayuda para dejar de fumar, hable con el mdico.  Haga comidas pequeas y frecuentes en lugar de 3 comidas abundantes y pesadas.  QU ALIMENTOS PUEDO COMER? Cereales Incluya granos y almidones saludables, como papas, pan integral, cereales ricos en fibras y arroz integral. Elija opciones de cereales integrales siempre que sea posible. En los adultos, los cereales integrales deben representar del 45% al 65% de las caloras diarias. Frutas y verduras Coma muchas frutas y verduras. Las frutas y verduras frescas aportan fibra a la dieta. Carnes y otras fuentes de protenas Coma carnes magras, como pollo y cerdo. Quite la grasa de la carne antes de cocinarla. Los huevos, el pescado y los frijoles son otras fuentes de protenas. En los adultos, estos alimentos deben representar entre el 10% y el 35% de las caloras diarias. Lcteos Elija leche y otros productos lcteos con bajo contenido de grasas. Los lcteos aportan grasas y protenas, adems de calcio. Grasas y aceites   Limite los alimentos con alto contenido de grasas, como las comidas fritas, los dulces, los productos horneados y las bebidas azucaradas. Otros Los condimentos y las salsas cremosas, como la mayonesa, pueden aportar  grasa adicional. Piense si necesita usarlos o no, o use pequeas cantidades u opciones con bajo contenido de grasas. QU ALIMENTOS NO ESTN RECOMENDADOS?  Los alimentos con alto contenido de grasas, por ejemplo: ? Alimentos horneados. ? Helados. ? Tostadas francesas. ? Panecillos dulces. ? Pizza. ? Pan de queso. ? Alimentos rebozados o cubiertos con mantequilla, salsas cremosas o queso. ? Comidas fritas. ? Postres y bebidas azucaradas.  Alimentos que causan gases o meteorismo  Esta informacin no tiene como fin reemplazar el consejo del mdico. Asegrese de hacerle al mdico cualquier pregunta que tenga. Document Released: 09/23/2013 Document Revised: 09/23/2013 Document Reviewed: 09/01/2013 Elsevier Interactive Patient Education  2017 Elsevier Inc.  

## 2017-09-26 NOTE — Progress Notes (Signed)
   Subjective:  Patient ID: Erin Parker, female    DOB: 02/27/1981  Age: 36 y.o. MRN: [id number]  CC: Abdominal Pain   HPI NigerEmilia Melquiadez Parker presents for history of abdominal pain. Interpreter services used. History of ED visit in November for RUQ abdominal pain with episodes of N/V. Workup inc.a abdominal US which found cholelithiasis measuring up in 1.1 cm in size and fatty infiltrates of the liver. She previous history of gallstones 2 yrs ago. She denies any current N/V or fevers. She does reports occasional abdominal pain. She reports history of HLD. She reports taking omega 3 supplements. No history of HTN.     Outpatient Medications Prior to Visit  Medication Sig Dispense Refill  . Omega-3 Fatty Acids (OMEGA 3 PO) Take 1 tablet daily by mouth.    Marland Kitchen. ibuprofen (ADVIL,MOTRIN) 600 MG tablet Take 1 tablet (600 mg total) by mouth every 6 (six) hours. (Patient not taking: Reported on 08/17/2017) 30 tablet 0   No facility-administered medications prior to visit.     ROS Review of Systems  Constitutional: Negative.   Respiratory: Negative.   Cardiovascular: Negative.   Gastrointestinal: Positive for abdominal pain (occasional/ history of cholelithiasis).        Objective:  BP 114/70   Pulse 61   Temp 98.4 F (36.9 C) (Oral)   Wt 138 lb 12.8 oz (63 kg)   SpO2 100%   BMI 26.23 kg/m   BP/Weight 09/26/2017 08/17/2017 08/16/2017  Systolic BP 114 105 -  Diastolic BP 70 71 -  Wt. (Lbs) 138.8 - 166  BMI 26.23 31.37 -     Physical Exam  Constitutional: She appears well-developed and well-nourished.  HENT:  Head: Normocephalic and atraumatic.  Right Ear: External ear normal.  Left Ear: External ear normal.  Nose: Nose normal.  Mouth/Throat: Oropharynx is clear and moist.  Eyes: Conjunctivae are normal. No scleral icterus.  Neck: Normal range of motion. Neck supple.  Cardiovascular: Normal rate, regular rhythm, normal heart sounds and intact distal  pulses.  Pulmonary/Chest: Effort normal and breath sounds normal.  Abdominal: Soft. Bowel sounds are normal. There is tenderness (epigastric).  Skin: Skin is warm and dry.  Nursing note and vitals reviewed.    Assessment & Plan:   1. History of cholelithiasis Apply for orange card and discount program. - Ambulatory referral to Gastroenterology - CMP and Liver - CBC  2. Epigastric abdominal tenderness without rebound tenderness  - Ambulatory referral to Gastroenterology  3. Dyslipidemia  - Lipid Panel  4. Flu vaccine need  - Flu Vaccine QUAD 6+ mos PF IM (Fluarix Quad PF)  5. Healthcare maintenance  - Tdap vaccine greater than or equal to 7yo IM     Follow-up: Return in about 8 weeks (around 11/21/2017) for Follow Up.   Lizbeth BarkMandesia R Atina Feeley FNP

## 2017-09-27 LAB — LIPID PANEL
CHOLESTEROL TOTAL: 132 mg/dL (ref 100–199)
Chol/HDL Ratio: 3.7 ratio (ref 0.0–4.4)
HDL: 36 mg/dL — AB (ref >39)
LDL Calculated: 76 mg/dL (ref 0–99)
Triglycerides: 99 mg/dL (ref 0–149)
VLDL CHOLESTEROL CAL: 20 mg/dL (ref 5–40)

## 2017-09-27 LAB — CMP AND LIVER
ALT: 29 IU/L (ref 0–32)
AST: 19 IU/L (ref 0–40)
Albumin: 4.5 g/dL (ref 3.5–5.5)
Alkaline Phosphatase: 74 IU/L (ref 39–117)
BILIRUBIN, DIRECT: 0.08 mg/dL (ref 0.00–0.40)
BUN: 5 mg/dL — ABNORMAL LOW (ref 6–20)
Bilirubin Total: 0.3 mg/dL (ref 0.0–1.2)
CHLORIDE: 104 mmol/L (ref 96–106)
CO2: 22 mmol/L (ref 20–29)
Calcium: 9.1 mg/dL (ref 8.7–10.2)
Creatinine, Ser: 0.49 mg/dL — ABNORMAL LOW (ref 0.57–1.00)
GFR calc Af Amer: 145 mL/min/{1.73_m2} (ref >59)
GFR calc non Af Amer: 126 mL/min/{1.73_m2} (ref >59)
Glucose: 89 mg/dL (ref 65–99)
Potassium: 4 mmol/L (ref 3.5–5.2)
Sodium: 140 mmol/L (ref 134–144)
Total Protein: 6.9 g/dL (ref 6.0–8.5)

## 2017-09-27 LAB — CBC
HEMOGLOBIN: 13.1 g/dL (ref 11.1–15.9)
Hematocrit: 37.3 % (ref 34.0–46.6)
MCH: 32.8 pg (ref 26.6–33.0)
MCHC: 35.1 g/dL (ref 31.5–35.7)
MCV: 93 fL (ref 79–97)
Platelets: 196 10*3/uL (ref 150–379)
RBC: 4 x10E6/uL (ref 3.77–5.28)
RDW: 14.3 % (ref 12.3–15.4)
WBC: 6.3 10*3/uL (ref 3.4–10.8)

## 2017-10-08 ENCOUNTER — Ambulatory Visit: Payer: Self-pay | Attending: Family Medicine

## 2017-10-29 ENCOUNTER — Telehealth (INDEPENDENT_AMBULATORY_CARE_PROVIDER_SITE_OTHER): Payer: Self-pay | Admitting: *Deleted

## 2017-10-29 NOTE — Telephone Encounter (Signed)
Medical Assistant used [employer] to contact patient.  Interpreter Name: Ruel Favorsdgar Interpreter #: [id number] Patient was not available, [employer] left patient a voicemail. Voicemail states to give a call back to [place] [facility] at [phone]. !!!Please inform patient of labs being normal including kidney and liver function. Continue with Lovaza for cholesterol!!!

## 2017-10-29 NOTE — Telephone Encounter (Signed)
-----   Message from Lizbeth BarkMandesia R Hairston, FNP sent at 10/03/2017  1:48 PM EST ----- HDL cholesterol level is decreased. Start eating a diet higher in healthy fats-olive oil, fatty fish, nuts. Continue to take lovaza.  Liver function normal Kidney function normal Labs normal

## 2017-10-30 ENCOUNTER — Telehealth: Payer: Self-pay | Admitting: Family Medicine

## 2017-10-30 NOTE — Telephone Encounter (Signed)
Pt. Returned nurse call and was informed of her lab results. Pt. Had not questions.

## 2017-11-07 ENCOUNTER — Telehealth: Payer: Self-pay | Admitting: Gastroenterology

## 2017-11-07 ENCOUNTER — Ambulatory Visit (INDEPENDENT_AMBULATORY_CARE_PROVIDER_SITE_OTHER): Payer: Self-pay | Admitting: Gastroenterology

## 2017-11-07 ENCOUNTER — Encounter: Payer: Self-pay | Admitting: Gastroenterology

## 2017-11-07 VITALS — BP 92/58 | HR 71 | Ht 58.5 in | Wt 132.0 lb

## 2017-11-07 DIAGNOSIS — K8020 Calculus of gallbladder without cholecystitis without obstruction: Secondary | ICD-10-CM

## 2017-11-07 DIAGNOSIS — K8050 Calculus of bile duct without cholangitis or cholecystitis without obstruction: Secondary | ICD-10-CM

## 2017-11-07 DIAGNOSIS — R7989 Other specified abnormal findings of blood chemistry: Secondary | ICD-10-CM

## 2017-11-07 DIAGNOSIS — R945 Abnormal results of liver function studies: Secondary | ICD-10-CM

## 2017-11-07 MED ORDER — OXYCODONE-ACETAMINOPHEN 2.5-325 MG PO TABS: 1.0000 | ORAL_TABLET | Freq: Four times a day (QID) | ORAL | 0 refills | Status: DC | PRN

## 2017-11-07 MED ORDER — OXYCODONE-ACETAMINOPHEN 2.5-325 MG PO TABS: 1.0000 | ORAL_TABLET | ORAL | 0 refills | Status: DC | PRN

## 2017-11-07 NOTE — Patient Instructions (Signed)
If you are age 37 or older, your body mass index should be between 23-30. Your Body mass index is 27.12 kg/m. If this is out of the aforementioned range listed, please consider follow up with your Primary Care Provider.  If you are age 864 or younger, your body mass index should be between 19-25. Your Body mass index is 27.12 kg/m. If this is out of the aformentioned range listed, please consider follow up with your Primary Care Provider.   [facility]. They are located at [address], [address]. They will contact you to make the appointment. Should you need to reschedule your appointment, please contact them at [phone].  Thank you for choosing [facility]  Dr Amada JupiterHenry Danis III

## 2017-11-07 NOTE — Addendum Note (Signed)
Addended by: Charlie PitterANIS, Gala Padovano L on: 11/07/2017 10:43 AM   Modules accepted: Orders

## 2017-11-07 NOTE — Telephone Encounter (Signed)
[facility] to state that they do not dispense oxycodone medication out, so it will have to be sent somewhere else.
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
## 2017-11-07 NOTE — Telephone Encounter (Signed)
Dr Myrtie Neitheranis [facility]

## 2017-11-08 ENCOUNTER — Telehealth: Payer: Self-pay

## 2017-11-08 NOTE — Telephone Encounter (Signed)
Records faxed to [facility]. Will await appointment info 

## 2017-11-13 ENCOUNTER — Telehealth: Payer: Self-pay | Admitting: Family Medicine

## 2017-11-13 NOTE — Telephone Encounter (Signed)
[facility] called requesting for a referral to be sent regarding patient therefore they can go ahead and schedule her. Please fu, Erin Parker stated you could contact her.

## 2017-11-13 NOTE — Telephone Encounter (Signed)
[facility] Mar[facility]Sarah at [facility] has to have the [facility] from [facility]. I called and requested the referral be placed. expressed concern as she is symptomatic. Will await response from patient primary provider to proceed.

## 2017-11-14 NOTE — Telephone Encounter (Signed)
Can you please, forwarder to her pcp  Thank you

## 2017-11-15 ENCOUNTER — Other Ambulatory Visit: Payer: Self-pay | Admitting: Family Medicine

## 2017-11-15 DIAGNOSIS — Z8719 Personal history of other diseases of the digestive system: Secondary | ICD-10-CM

## 2017-11-15 NOTE — Telephone Encounter (Signed)
Referral was placed 09/26/17 with GI, however I will place another referral.

## 2017-11-19 NOTE — Telephone Encounter (Signed)
Noted  Same a new referral to  [facility]

## 2017-11-20 ENCOUNTER — Telehealth: Payer: Self-pay | Admitting: Family Medicine

## 2017-11-20 ENCOUNTER — Other Ambulatory Visit: Payer: Self-pay | Admitting: Family Medicine

## 2017-11-20 DIAGNOSIS — K829 Disease of gallbladder, unspecified: Secondary | ICD-10-CM

## 2017-11-20 DIAGNOSIS — Z8719 Personal history of other diseases of the digestive system: Secondary | ICD-10-CM

## 2017-11-20 NOTE — Telephone Encounter (Signed)
[facility] called and stated since pt has orange card the referral must go through [facility] first and they will contact her.

## 2017-11-20 NOTE — Telephone Encounter (Signed)
Urgent referral placed

## 2017-11-20 NOTE — Telephone Encounter (Signed)
Noted  Sent to [facility]

## 2017-11-20 NOTE — Telephone Encounter (Signed)
The patient doesn't need a referral here to GI. Dr Myrtie Neitheranis has seen her (11-08-2017) . He recommends the she go to [facility] for a surgical consult, due to her symptomatic gallbladder disease. Please set up the referral for [facility].   Note: 11-08-2017  Per Sarah at [facility]. Pt has to have the referral from the [facility]. I called and requested the referral be placed. expressed concern as she is symptomatic. Will await response from patient primary provider to proceed.

## 2017-11-20 NOTE — Telephone Encounter (Signed)
Thank you   Noted  [facility]

## 2017-11-22 ENCOUNTER — Ambulatory Visit: Payer: Self-pay | Admitting: Family Medicine

## 2017-11-23 ENCOUNTER — Encounter: Payer: Self-pay | Admitting: Internal Medicine

## 2017-11-23 ENCOUNTER — Ambulatory Visit: Payer: Self-pay | Attending: Internal Medicine | Admitting: Internal Medicine

## 2017-11-23 VITALS — BP 100/63 | HR 61 | Temp 98.3°F | Resp 18 | Ht 60.0 in | Wt 132.0 lb

## 2017-11-23 DIAGNOSIS — Z79891 Long term (current) use of opiate analgesic: Secondary | ICD-10-CM | POA: Insufficient documentation

## 2017-11-23 DIAGNOSIS — K8050 Calculus of bile duct without cholangitis or cholecystitis without obstruction: Secondary | ICD-10-CM

## 2017-11-23 DIAGNOSIS — K8070 Calculus of gallbladder and bile duct without cholecystitis without obstruction: Secondary | ICD-10-CM | POA: Insufficient documentation

## 2017-11-23 DIAGNOSIS — K8020 Calculus of gallbladder without cholecystitis without obstruction: Secondary | ICD-10-CM

## 2017-11-23 NOTE — Progress Notes (Signed)
Patient ID: Erin Parker, female    DOB: [birth year]  MRN: [id number]  CC: Establish Care   Subjective: Erin Parker is a 37 y.o. female who presents to est care with me Her concerns today include:   1.  Seen recently by GI, Dr. Myrtie Neither.  Assessed to have bilary colic.  Referred to general surgery.  Appt scheduled for 11/28/2017 -abdominal pain 2-3 times a wk.  Can last 3-4 hrs. + Nausea. -pain  when she eats greasy foods and sweet drinks.  -no surgery in past  2.  PAP: last pap was done almost 2 yrs ago.  Patient Active Problem List   Diagnosis Date Noted  . Post term pregnancy 12/26/2015     Current Outpatient Medications on File Prior to Visit  Medication Sig Dispense Refill  . Omega-3 Fatty Acids (OMEGA 3 PO) Take 1 tablet daily by mouth.    Marland Kitchen oxycodone-acetaminophen (PERCOCET) 2.5-325 MG tablet Take 1 tablet by mouth every 4 (four) hours as needed for pain. 30 tablet 0   No current facility-administered medications on file prior to visit.     No Known Allergies  Social History   Socioeconomic History  . Marital status: Single    Spouse name: Not on file  . Number of children: Not on file  . Years of education: Not on file  . Highest education level: Not on file  Social Needs  . Financial resource strain: Not on file  . Food insecurity - worry: Not on file  . Food insecurity - inability: Not on file  . Transportation needs - medical: Not on file  . Transportation needs - non-medical: Not on file  Occupational History  . Not on file  Tobacco Use  . Smoking status: Never Smoker  . Smokeless tobacco: Never Used  Substance and Sexual Activity  . Alcohol use: No  . Drug use: No  . Sexual activity: Not on file  Other Topics Concern  . Not on file  Social History Narrative  . Not on file    Family History  Problem Relation Age of Onset  . Colon cancer Neg Hx   . Stomach cancer Neg Hx   . Esophageal cancer Neg Hx     No past  surgical history on file.  ROS: Review of Systems Negative except as stated above PHYSICAL EXAM: BP 100/63 (BP Location: Right Arm, Patient Position: Sitting, Cuff Size: Normal)   Pulse 61   Temp 98.3 F (36.8 C) (Oral)   Resp 18   Ht 5' (1.524 m)   Wt 132 lb (59.9 kg)   LMP 10/29/2017   SpO2 100%   BMI 25.78 kg/m   Physical Exam  General appearance - alert, well appearing, and in no distress Mental status - alert, oriented to person, place, and time, normal mood, behavior, speech, dress, motor activity, and thought processes Mouth - mucous membranes moist, pharynx normal without lesions Neck - supple, no significant adenopathy Chest - clear to auscultation, no wheezes, rales or rhonchi, symmetric air entry Heart - normal rate, regular rhythm, normal S1, S2, no murmurs, rubs, clicks or gallops Abdomen - soft, nontender, nondistended, no masses or organomegaly  ASSESSMENT AND PLAN: 1. Gall stones 2. Biliary colic Patient to keep appointment with general surgeon.  She still has some Percocets that were given to her by the gastroenterologist to use as needed for pain.  Advised to avoid eating foods that cause pain to flareup.  Patient given precautions of  when to be seen in the emergency room while awaiting appt with surgeon  Patient was given the opportunity to ask questions.  Patient verbalized understanding of the plan and was able to repeat key elements of the plan.   No orders of the defined types were placed in this encounter.    Requested Prescriptions    No prescriptions requested or ordered in this encounter    Return if symptoms worsen or fail to improve.  Jonah Blueeborah Asiana Benninger, MD, FACP

## 2017-11-28 ENCOUNTER — Ambulatory Visit: Payer: Self-pay | Admitting: General Surgery

## 2017-11-28 NOTE — H&P (View-Only) (Signed)
Erin Parker Documented: 11/28/2017 11:39 AM [facility] Patient #: [id number] DOB: 1980-10-25 Single / Language: Erin Parker / Race: White Female  History of Present Illness Erin Parker M. Alizah Sills Parker; 11/28/2017 12:06 PM) The patient is a 37 year old female who presents for evaluation of gall stones. She is referred by Christophe Louis FNP for evaluation of gallstones. With the aid of a phone interpreter she states that she has been having epigastric to right shoulder discomfort. It is associated with nausea and some occasional diarrhea. Episodes will last several hours and then eased up. Generally occurs after meals. An generally more with fatty foods. She has been to the emergency room in the past. Ultrasound imaging revealed gallstones. She did have transaminases elevated in the 100s back in the fall but normal LFTs in December. She denies any melena or hematochezia. She denies any frequent NSAID use. She denies any fevers or chills. She saw gastroenterology who felt this was most consistent with biliary colic   Problem List/Past Medical Erin Parker M. Andrey Campanile, Parker; 11/28/2017 12:06 PM) SYMPTOMATIC CHOLELITHIASIS (K80.20)  Past Surgical History (April Staton, CMA; 11/28/2017 11:40 AM) No pertinent past surgical history  Diagnostic Studies History (April Staton, CMA; 11/28/2017 11:40 AM) Colonoscopy never Mammogram never Pap Smear 1-5 years ago  Allergies (April Staton, CMA; 11/28/2017 11:40 AM) No Known Drug Allergies [11/28/2017]:  Medication History (April Staton, CMA; 11/28/2017 11:42 AM) Omega-3 (Oral) Active. Medications Reconciled  Social History (April Staton, [place]; 11/28/2017 11:40 AM) No alcohol use No caffeine use No drug use Tobacco use Never smoker.  Pregnancy / Birth History (April Joana Reamer, [place]; 11/28/2017 11:40 AM) Age at menarche 14 years. Contraceptive History Oral contraceptives. Gravida 4 Irregular periods Length (months)  of breastfeeding 12-24 Maternal age 16-20 Para 4  Other Problems Erin Parker M. Andrey Campanile, Parker; 11/28/2017 12:06 PM) Gastroesophageal Reflux Disease     Review of Systems (April Staton CMA; 11/28/2017 11:40 AM) General Not Present- Appetite Loss, Chills, Fatigue, Fever, Night Sweats, Weight Gain and Weight Loss. Skin Not Present- Change in Wart/Mole, Dryness, Hives, Jaundice, New Lesions, Non-Healing Wounds, Rash and Ulcer. HEENT Present- Visual Disturbances and Wears glasses/contact lenses. Not Present- Earache, Hearing Loss, Hoarseness, Nose Bleed, Oral Ulcers, Ringing in the Ears, Seasonal Allergies, Sinus Pain, Sore Throat and Yellow Eyes. Respiratory Not Present- Bloody sputum, Chronic Cough, Difficulty Breathing, Snoring and Wheezing. Breast Not Present- Breast Mass, Breast Pain, Nipple Discharge and Skin Changes. Cardiovascular Not Present- Chest Pain, Difficulty Breathing Lying Down, Leg Cramps, Palpitations, Rapid Heart Rate, Shortness of Breath and Swelling of Extremities. Gastrointestinal Present- Abdominal Pain, Constipation, Indigestion and Nausea. Not Present- Bloating, Bloody Stool, Change in Bowel Habits, Chronic diarrhea, Difficulty Swallowing, Excessive gas, Gets full quickly at meals, Hemorrhoids, Rectal Pain and Vomiting. Female Genitourinary Present- Pelvic Pain. Not Present- Frequency, Nocturia, Painful Urination and Urgency. Musculoskeletal Present- Back Pain. Not Present- Joint Pain, Joint Stiffness, Muscle Pain, Muscle Weakness and Swelling of Extremities. Neurological Present- Headaches. Not Present- Decreased Memory, Fainting, Numbness, Seizures, Tingling, Tremor, Trouble walking and Weakness. Psychiatric Not Present- Anxiety, Bipolar, Change in Sleep Pattern, Depression, Fearful and Frequent crying. Endocrine Present- Excessive Hunger and Hair Changes. Not Present- Cold Intolerance, Heat Intolerance, Hot flashes and New Diabetes. Hematology Present- Easy Bruising. Not  Present- Blood Thinners, Excessive bleeding, Gland problems, HIV and Persistent Infections.  Vitals (April Staton CMA; 11/28/2017 11:43 AM) 11/28/2017 11:43 AM Weight: 130.5 lb Height: 62in Body Surface Area: 1.59 m Body Mass Index: 23.87 kg/m  Temp.: 98.26F(Oral)  Pulse: 67 (Regular)  BP: 100/68 (Sitting, Left Arm, Standard)      Physical Exam Erin Parker; 11/28/2017 12:04 PM)  General Mental Status-Alert. General Appearance-Consistent with stated age. Hydration-Well hydrated. Voice-Normal.  Head and Neck Head-normocephalic, atraumatic with no lesions or palpable masses. Trachea-midline. Thyroid Gland Characteristics - normal size and consistency.  Eye Eyeball - Bilateral-Extraocular movements intact. Sclera/Conjunctiva - Bilateral-No scleral icterus.  ENMT Ears -Note:normal external ears; lips intact.   Chest and Lung Exam Chest and lung exam reveals -quiet, even and easy respiratory effort with no use of accessory muscles and on auscultation, normal breath sounds, no adventitious sounds and normal vocal resonance. Inspection Chest Wall - Normal. Back - normal.  Breast - Did not examine.  Cardiovascular Cardiovascular examination reveals -normal heart sounds, regular rate and rhythm with no murmurs and normal pedal pulses bilaterally.  Abdomen Inspection Inspection of the abdomen reveals - No Hernias. Skin - Scar - no surgical scars. Palpation/Percussion Palpation and Percussion of the abdomen reveal - Soft, Non Tender, No Rebound tenderness, No Rigidity (guarding) and No hepatosplenomegaly. Auscultation Auscultation of the abdomen reveals - Bowel sounds normal.  Peripheral Vascular Upper Extremity Palpation - Pulses bilaterally normal.  Neurologic Neurologic evaluation reveals -alert and oriented x 3 with no impairment of recent or remote memory. Mental Status-Normal.  Neuropsychiatric The patient's mood  and affect are described as -normal. Judgment and Insight-insight is appropriate concerning matters relevant to self.  Musculoskeletal Normal Exam - Left-Upper Extremity Strength Normal and Lower Extremity Strength Normal. Normal Exam - Right-Upper Extremity Strength Normal and Lower Extremity Strength Normal.  Lymphatic Head & Neck  General Head & Neck Lymphatics: Bilateral - Description - Normal. Axillary - Did not examine. Femoral & Inguinal - Did not examine.    Assessment & Plan Erin Parker Marksberry M. 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  with the aid of phone interpreter, We discussed gallbladder disease. The patient was given Agricultural engineereducational material. We discussed non-operative and operative management. We discussed the signs & symptoms of acute cholecystitis  I discussed laparoscopic cholecystectomy with IOC in detail. The patient was given educational material as well as diagrams detailing the procedure. We discussed the risks and benefits of a laparoscopic cholecystectomy including, but not limited to bleeding, infection, injury to surrounding structures such as the intestine or liver, bile leak, retained gallstones, need to convert to an open procedure, prolonged diarrhea, blood clots such as DVT, common bile duct injury, anesthesia risks, and possible need for additional procedures. We discussed the typical post-operative recovery course. I explained that the likelihood of improvement of their symptoms is good.  The patient has elected to proceed with surgery.  Current Plans Pt Education - Pamphlet Given - Laparoscopic Gallbladder Surgery: discussed with patient and provided information.  Mary SellaEric M. Andrey CampanileWilson, Parker, FACS General, Bariatric, & Minimally Invasive Surgery [facility] [place]

## 2017-11-28 NOTE — Telephone Encounter (Signed)
Pt was scheduled for 11-28-2017. Seen by Dr.Eric Andrey CampanileWilson.

## 2017-11-28 NOTE — Telephone Encounter (Signed)
Pt has been scheduled for 11-28-2017 with Dr Gaynelle AduEric Wilson at [facility]

## 2017-11-28 NOTE — H&P (Signed)
Erin Parker Documented: 11/28/2017 11:39 AM Location[facility] Patient #: [id number] DOB: 1980-10-25 Single / Language: Shon Hough / Race: White Female  History of Present Illness Erin Parker M. Erin Usman MD; 11/28/2017 12:06 PM) The patient is a 37 year old female who presents for evaluation of gall stones. She is referred by Christophe Louis FNP for evaluation of gallstones. With the aid of a phone interpreter she states that she has been having epigastric to right shoulder discomfort. It is associated with nausea and some occasional diarrhea. Episodes will last several hours and then eased up. Generally occurs after meals. An generally more with fatty foods. She has been to the emergency room in the past. Ultrasound imaging revealed gallstones. She did have transaminases elevated in the 100s back in the fall but normal LFTs in December. She denies any melena or hematochezia. She denies any frequent NSAID use. She denies any fevers or chills. She saw gastroenterology who felt this was most consistent with biliary colic   Problem List/Past Medical Erin Parker M. Andrey Campanile, MD; 11/28/2017 12:06 PM) SYMPTOMATIC CHOLELITHIASIS (K80.20)  Past Surgical History (April Staton, CMA; 11/28/2017 11:40 AM) No pertinent past surgical history  Diagnostic Studies History (April Staton, CMA; 11/28/2017 11:40 AM) Colonoscopy never Mammogram never Pap Smear 1-5 years ago  Allergies (April Staton, CMA; 11/28/2017 11:40 AM) No Known Drug Allergies [11/28/2017]:  Medication History (April Staton, CMA; 11/28/2017 11:42 AM) Omega-3 (Oral) Active. Medications Reconciled  Social History (April Staton, [place]; 11/28/2017 11:40 AM) No alcohol use No caffeine use No drug use Tobacco use Never smoker.  Pregnancy / Birth History (April Joana Reamer, [place]; 11/28/2017 11:40 AM) Age at menarche 14 years. Contraceptive History Oral contraceptives. Gravida 4 Irregular periods Length (months)  of breastfeeding 12-24 Maternal age 16-20 Para 4  Other Problems Erin Parker M. Andrey Campanile, MD; 11/28/2017 12:06 PM) Gastroesophageal Reflux Disease     Review of Systems (April Staton CMA; 11/28/2017 11:40 AM) General Not Present- Appetite Loss, Chills, Fatigue, Fever, Night Sweats, Weight Gain and Weight Loss. Skin Not Present- Change in Wart/Mole, Dryness, Hives, Jaundice, New Lesions, Non-Healing Wounds, Rash and Ulcer. HEENT Present- Visual Disturbances and Wears glasses/contact lenses. Not Present- Earache, Hearing Loss, Hoarseness, Nose Bleed, Oral Ulcers, Ringing in the Ears, Seasonal Allergies, Sinus Pain, Sore Throat and Yellow Eyes. Respiratory Not Present- Bloody sputum, Chronic Cough, Difficulty Breathing, Snoring and Wheezing. Breast Not Present- Breast Mass, Breast Pain, Nipple Discharge and Skin Changes. Cardiovascular Not Present- Chest Pain, Difficulty Breathing Lying Down, Leg Cramps, Palpitations, Rapid Heart Rate, Shortness of Breath and Swelling of Extremities. Gastrointestinal Present- Abdominal Pain, Constipation, Indigestion and Nausea. Not Present- Bloating, Bloody Stool, Change in Bowel Habits, Chronic diarrhea, Difficulty Swallowing, Excessive gas, Gets full quickly at meals, Hemorrhoids, Rectal Pain and Vomiting. Female Genitourinary Present- Pelvic Pain. Not Present- Frequency, Nocturia, Painful Urination and Urgency. Musculoskeletal Present- Back Pain. Not Present- Joint Pain, Joint Stiffness, Muscle Pain, Muscle Weakness and Swelling of Extremities. Neurological Present- Headaches. Not Present- Decreased Memory, Fainting, Numbness, Seizures, Tingling, Tremor, Trouble walking and Weakness. Psychiatric Not Present- Anxiety, Bipolar, Change in Sleep Pattern, Depression, Fearful and Frequent crying. Endocrine Present- Excessive Hunger and Hair Changes. Not Present- Cold Intolerance, Heat Intolerance, Hot flashes and New Diabetes. Hematology Present- Easy Bruising. Not  Present- Blood Thinners, Excessive bleeding, Gland problems, HIV and Persistent Infections.  Vitals (April Staton CMA; 11/28/2017 11:43 AM) 11/28/2017 11:43 AM Weight: 130.5 lb Height: 62in Body Surface Area: 1.59 m Body Mass Index: 23.87 kg/m  Temp.: 98.26F(Oral)  Pulse: 67 (Regular)  BP: 100/68 (Sitting, Left Arm, Standard)      Physical Exam Erin Parker(Erin Posas M. Nixon Sparr MD; 11/28/2017 12:04 PM)  General Mental Status-Alert. General Appearance-Consistent with stated age. Hydration-Well hydrated. Voice-Normal.  Head and Neck Head-normocephalic, atraumatic with no lesions or palpable masses. Trachea-midline. Thyroid Gland Characteristics - normal size and consistency.  Eye Eyeball - Bilateral-Extraocular movements intact. Sclera/Conjunctiva - Bilateral-No scleral icterus.  ENMT Ears -Note:normal external ears; lips intact.   Chest and Lung Exam Chest and lung exam reveals -quiet, even and easy respiratory effort with no use of accessory muscles and on auscultation, normal breath sounds, no adventitious sounds and normal vocal resonance. Inspection Chest Wall - Normal. Back - normal.  Breast - Did not examine.  Cardiovascular Cardiovascular examination reveals -normal heart sounds, regular rate and rhythm with no murmurs and normal pedal pulses bilaterally.  Abdomen Inspection Inspection of the abdomen reveals - No Hernias. Skin - Scar - no surgical scars. Palpation/Percussion Palpation and Percussion of the abdomen reveal - Soft, Non Tender, No Rebound tenderness, No Rigidity (guarding) and No hepatosplenomegaly. Auscultation Auscultation of the abdomen reveals - Bowel sounds normal.  Peripheral Vascular Upper Extremity Palpation - Pulses bilaterally normal.  Neurologic Neurologic evaluation reveals -alert and oriented x 3 with no impairment of recent or remote memory. Mental Status-Normal.  Neuropsychiatric The patient's mood  and affect are described as -normal. Judgment and Insight-insight is appropriate concerning matters relevant to self.  Musculoskeletal Normal Exam - Left-Upper Extremity Strength Normal and Lower Extremity Strength Normal. Normal Exam - Right-Upper Extremity Strength Normal and Lower Extremity Strength Normal.  Lymphatic Head & Neck  General Head & Neck Lymphatics: Bilateral - Description - Normal. Axillary - Did not examine. Femoral & Inguinal - Did not examine.    Assessment & Plan Erin Parker(Gwendoline Judy M. Hadrian Yarbrough MD; 11/28/2017 12:03 PM)  SYMPTOMATIC CHOLELITHIASIS (K80.20) Impression: I believe the patient's symptoms are consistent with gallbladder disease.  with the aid of phone interpreter, We discussed gallbladder disease. The patient was given Agricultural engineereducational material. We discussed non-operative and operative management. We discussed the signs & symptoms of acute cholecystitis  I discussed laparoscopic cholecystectomy with IOC in detail. The patient was given educational material as well as diagrams detailing the procedure. We discussed the risks and benefits of a laparoscopic cholecystectomy including, but not limited to bleeding, infection, injury to surrounding structures such as the intestine or liver, bile leak, retained gallstones, need to convert to an open procedure, prolonged diarrhea, blood clots such as DVT, common bile duct injury, anesthesia risks, and possible need for additional procedures. We discussed the typical post-operative recovery course. I explained that the likelihood of improvement of their symptoms is good.  The patient has elected to proceed with surgery.  Current Plans Pt Education - Pamphlet Given - Laparoscopic Gallbladder Surgery: discussed with patient and provided information.  Mary SellaEric M. Andrey CampanileWilson, MD, FACS General, Bariatric, & Minimally Invasive Surgery [facility] [place]

## 2017-11-30 ENCOUNTER — Telehealth: Payer: Self-pay | Admitting: Internal Medicine

## 2017-11-30 NOTE — Telephone Encounter (Signed)
[facility] fax will on the PCP in-box

## 2017-12-03 NOTE — Patient Instructions (Signed)
BoliviaEmilia Melquiadez-Valois  12/03/2017   Your procedure is scheduled on: 12/14/2017   [facility]  [facility]      ARRIVE AT 530 AM. Have a seat in the Main Lobby. Please note there is a phone at the [employer] Information [employer]k. Please call [phone] on that phone. Someone from Short Stay will come and get you from the Main Lobby and take you to Short Stay.  Call this number if you have problems the morning of surgery [phone]   Remember: Do not eat food  :After Midnight.     Take these medicines the morning of surgery with A SIP OF WATER: none                                 You may not have any metal on your body including hair pins and              piercings  Do not wear jewelry, make-up, lotions, powders or perfumes, deodorant             Do not wear nail polish.  Do not shave  48 hours prior to surgery.                Do not bring valuables to the hospital. [place] IS NOT             RESPONSIBLE   FOR VALUABLES.  Contacts, dentures or bridgework may not be worn into surgery.      Patients discharged the day of surgery will not be allowed to drive home.  Name and phone number of your driver:                Please read over the following fact sheets you were given: _____________________________________________________________________             [facility] - Pre[facility] Before surgery, you can play an important role.  Because skin is not sterile, your skin needs to be as free of germs as possible.  You can reduce the number of germs on your skin by washing with CHG (chlorahexidine gluconate) soap before surgery.  CHG is an antiseptic cleaner which kills germs and bonds with the skin to continue killing germs even after washing. Please DO NOT use if you have an allergy to CHG or antibacterial soaps.  If your skin becomes reddened/irritated stop using the CHG and inform your nurse when you arrive at Short Stay. Do not shave  (including legs and underarms) for at least 48 hours prior to the first CHG shower.  You may shave your face/neck. Please follow these instructions carefully:  1.  Shower with CHG Soap the night before surgery and the  morning of Surgery.  2.  If you choose to wash your hair, wash your hair first as usual with your  normal  shampoo.  3.  After you shampoo, rinse your hair and body thoroughly to remove the  shampoo.                           4.  Use CHG as you would any other liquid soap.  You can apply chg directly  to the skin and wash  Gently with a scrungie or clean washcloth.  5.  Apply the CHG Soap to your body ONLY FROM THE NECK DOWN.   Do not use on face/ open                           Wound or open sores. Avoid contact with eyes, ears mouth and genitals (private parts).                       Wash face,  Genitals (private parts) with your normal soap.             6.  Wash thoroughly, paying special attention to the area where your surgery  will be performed.  7.  Thoroughly rinse your body with warm water from the neck down.  8.  DO NOT shower/wash with your normal soap after using and rinsing off  the CHG Soap.                9.  Pat yourself dry with a clean towel.            10.  Wear clean pajamas.            11.  Place clean sheets on your bed the night of your first shower and do not  sleep with pets. Day of Surgery : Do not apply any lotions/deodorants the morning of surgery.  Please wear clean clothes to the hospital/surgery center.  FAILURE TO FOLLOW THESE INSTRUCTIONS MAY RESULT IN THE CANCELLATION OF YOUR SURGERY PATIENT SIGNATURE_________________________________  NURSE SIGNATURE__________________________________  ________________________________________________________________________ NO SOLID FOOD AFTER MIDNIGHT THE NIGHT PRIOR TO SURGERY. NOTHING BY MOUTH EXCEPT CLEAR LIQUIDS UNTIL 3 HOURS PRIOR TO SCHEULED SURGERY. PLEASE FINISH ENSURE DRINK PER  SURGEON ORDER 3 HOURS PRIOR TO SCHEDULED SURGERY TIME WHICH NEEDS TO BE COMPLETED AT ____________.

## 2017-12-06 ENCOUNTER — Other Ambulatory Visit: Payer: Self-pay

## 2017-12-06 ENCOUNTER — Encounter (HOSPITAL_COMMUNITY)
Admission: RE | Admit: 2017-12-06 | Discharge: 2017-12-06 | Disposition: A | Discharge status: Home / Self Care | Payer: Self-pay | Source: Ambulatory Visit | Attending: General Surgery | Admitting: General Surgery

## 2017-12-06 ENCOUNTER — Encounter (HOSPITAL_COMMUNITY): Payer: Self-pay

## 2017-12-06 DIAGNOSIS — Z01812 Encounter for preprocedural laboratory examination: Secondary | ICD-10-CM | POA: Insufficient documentation

## 2017-12-06 HISTORY — DX: Gastro-esophageal reflux disease without esophagitis: K21.9

## 2017-12-06 HISTORY — DX: Headache, unspecified: R51.9

## 2017-12-06 HISTORY — DX: Headache: R51

## 2017-12-06 LAB — COMPREHENSIVE METABOLIC PANEL
ALK PHOS: 64 U/L (ref 38–126)
ALT: 21 U/L (ref 14–54)
ANION GAP: 6 (ref 5–15)
AST: 21 U/L (ref 15–41)
Albumin: 4.4 g/dL (ref 3.5–5.0)
BILIRUBIN TOTAL: 0.5 mg/dL (ref 0.3–1.2)
BUN: 9 mg/dL (ref 6–20)
CALCIUM: 8.8 mg/dL — AB (ref 8.9–10.3)
CO2: 26 mmol/L (ref 22–32)
CREATININE: 0.51 mg/dL (ref 0.44–1.00)
Chloride: 106 mmol/L (ref 101–111)
Glucose, Bld: 94 mg/dL (ref 65–99)
Potassium: 4.2 mmol/L (ref 3.5–5.1)
Sodium: 138 mmol/L (ref 135–145)
TOTAL PROTEIN: 6.8 g/dL (ref 6.5–8.1)

## 2017-12-06 LAB — HCG, SERUM, QUALITATIVE: PREG SERUM: NEGATIVE

## 2017-12-06 LAB — CBC
HEMATOCRIT: 38.9 % (ref 36.0–46.0)
HEMOGLOBIN: 13.4 g/dL (ref 12.0–15.0)
MCH: 32.3 pg (ref 26.0–34.0)
MCHC: 34.4 g/dL (ref 30.0–36.0)
MCV: 93.7 fL (ref 78.0–100.0)
Platelets: 183 10*3/uL (ref 150–400)
RBC: 4.15 MIL/uL (ref 3.87–5.11)
RDW: 13.1 % (ref 11.5–15.5)
WBC: 5.3 10*3/uL (ref 4.0–10.5)

## 2017-12-14 ENCOUNTER — Ambulatory Visit (HOSPITAL_COMMUNITY)
Admission: RE | Admit: 2017-12-14 | Discharge: 2017-12-14 | Disposition: A | Discharge status: Home / Self Care | Payer: Self-pay | Source: Ambulatory Visit | Attending: General Surgery | Admitting: General Surgery

## 2017-12-14 ENCOUNTER — Ambulatory Visit (HOSPITAL_COMMUNITY): Payer: Self-pay

## 2017-12-14 ENCOUNTER — Encounter (HOSPITAL_COMMUNITY)
Admission: RE | Disposition: A | Discharge status: Home / Self Care | Payer: Self-pay | Source: Ambulatory Visit | Attending: General Surgery

## 2017-12-14 ENCOUNTER — Encounter (HOSPITAL_COMMUNITY): Payer: Self-pay | Admitting: *Deleted

## 2017-12-14 DIAGNOSIS — K8010 Calculus of gallbladder with chronic cholecystitis without obstruction: Secondary | ICD-10-CM | POA: Insufficient documentation

## 2017-12-14 DIAGNOSIS — Z419 Encounter for procedure for purposes other than remedying health state, unspecified: Secondary | ICD-10-CM

## 2017-12-14 HISTORY — PX: CHOLECYSTECTOMY: SHX55

## 2017-12-14 SURGERY — LAPAROSCOPIC CHOLECYSTECTOMY WITH INTRAOPERATIVE CHOLANGIOGRAM
Anesthesia: General

## 2017-12-14 MED ORDER — LIDOCAINE 2% (20 MG/ML) 5 ML SYRINGE
INTRAMUSCULAR | Status: DC | PRN
  Administered 2017-12-14: 60 mg via INTRAVENOUS

## 2017-12-14 MED ORDER — FENTANYL CITRATE (PF) 100 MCG/2ML IJ SOLN: 25.0000 ug | INTRAMUSCULAR | Status: DC | PRN

## 2017-12-14 MED ORDER — IOPAMIDOL (ISOVUE-300) INJECTION 61%
INTRAVENOUS | Status: DC | PRN
  Administered 2017-12-14: 5 mL

## 2017-12-14 MED ORDER — LIDOCAINE 2% (20 MG/ML) 5 ML SYRINGE
INTRAMUSCULAR | Status: AC
  Filled 2017-12-14: qty 5

## 2017-12-14 MED ORDER — ACETAMINOPHEN 500 MG PO TABS
1000.0000 mg | ORAL_TABLET | ORAL | Status: AC
  Administered 2017-12-14: 1000 mg via ORAL
  Filled 2017-12-14: qty 2

## 2017-12-14 MED ORDER — OXYCODONE HCL 5 MG PO TABS: 5.0000 mg | ORAL_TABLET | Freq: Four times a day (QID) | ORAL | 0 refills | Status: DC | PRN

## 2017-12-14 MED ORDER — SUGAMMADEX SODIUM 200 MG/2ML IV SOLN
INTRAVENOUS | Status: AC
  Filled 2017-12-14: qty 2

## 2017-12-14 MED ORDER — IOPAMIDOL (ISOVUE-300) INJECTION 61%
INTRAVENOUS | Status: AC
  Filled 2017-12-14: qty 50

## 2017-12-14 MED ORDER — MIDAZOLAM HCL 2 MG/2ML IJ SOLN
INTRAMUSCULAR | Status: AC
  Filled 2017-12-14: qty 2

## 2017-12-14 MED ORDER — FENTANYL CITRATE (PF) 250 MCG/5ML IJ SOLN
INTRAMUSCULAR | Status: AC
  Filled 2017-12-14: qty 5

## 2017-12-14 MED ORDER — PROPOFOL 10 MG/ML IV BOLUS
INTRAVENOUS | Status: DC | PRN
  Administered 2017-12-14: 150 mg via INTRAVENOUS

## 2017-12-14 MED ORDER — 0.9 % SODIUM CHLORIDE (POUR BTL) OPTIME
TOPICAL | Status: DC | PRN
  Administered 2017-12-14: 1000 mL

## 2017-12-14 MED ORDER — FENTANYL CITRATE (PF) 250 MCG/5ML IJ SOLN
INTRAMUSCULAR | Status: DC | PRN
  Administered 2017-12-14 (×5): 50 ug via INTRAVENOUS

## 2017-12-14 MED ORDER — KETAMINE HCL 10 MG/ML IJ SOLN
INTRAMUSCULAR | Status: DC | PRN
  Administered 2017-12-14: 20 mg via INTRAVENOUS

## 2017-12-14 MED ORDER — DEXAMETHASONE SODIUM PHOSPHATE 10 MG/ML IJ SOLN
INTRAMUSCULAR | Status: AC
  Filled 2017-12-14: qty 1

## 2017-12-14 MED ORDER — ROCURONIUM BROMIDE 10 MG/ML (PF) SYRINGE
PREFILLED_SYRINGE | INTRAVENOUS | Status: DC | PRN
  Administered 2017-12-14: 20 mg via INTRAVENOUS
  Administered 2017-12-14 (×2): 5 mg via INTRAVENOUS

## 2017-12-14 MED ORDER — LIDOCAINE 2% (20 MG/ML) 5 ML SYRINGE
INTRAMUSCULAR | Status: DC | PRN
  Administered 2017-12-14: 1 mg/kg/h via INTRAVENOUS

## 2017-12-14 MED ORDER — CHLORHEXIDINE GLUCONATE 4 % EX LIQD: 60.0000 mL | Freq: Once | CUTANEOUS | Status: DC

## 2017-12-14 MED ORDER — DEXAMETHASONE SODIUM PHOSPHATE 10 MG/ML IJ SOLN
INTRAMUSCULAR | Status: DC | PRN
  Administered 2017-12-14: 4 mg via INTRAVENOUS

## 2017-12-14 MED ORDER — LACTATED RINGERS IV SOLN
INTRAVENOUS | Status: DC | PRN
  Administered 2017-12-14 (×2): via INTRAVENOUS

## 2017-12-14 MED ORDER — BUPIVACAINE-EPINEPHRINE (PF) 0.5% -1:200000 IJ SOLN
INTRAMUSCULAR | Status: DC | PRN
  Administered 2017-12-14: 20 mL

## 2017-12-14 MED ORDER — ONDANSETRON HCL 4 MG/2ML IJ SOLN
INTRAMUSCULAR | Status: DC | PRN
  Administered 2017-12-14: 4 mg via INTRAVENOUS

## 2017-12-14 MED ORDER — SUCCINYLCHOLINE CHLORIDE 200 MG/10ML IV SOSY
PREFILLED_SYRINGE | INTRAVENOUS | Status: DC | PRN
  Administered 2017-12-14: 110 mg via INTRAVENOUS

## 2017-12-14 MED ORDER — CEFOTETAN DISODIUM-DEXTROSE 2-2.08 GM-%(50ML) IV SOLR
2.0000 g | INTRAVENOUS | Status: AC
  Administered 2017-12-14: 2 g via INTRAVENOUS
  Filled 2017-12-14: qty 50

## 2017-12-14 MED ORDER — LACTATED RINGERS IV SOLN
INTRAVENOUS | Status: DC
  Administered 2017-12-14: 06:00:00 via INTRAVENOUS

## 2017-12-14 MED ORDER — ONDANSETRON HCL 4 MG/2ML IJ SOLN
INTRAMUSCULAR | Status: AC
  Filled 2017-12-14: qty 2

## 2017-12-14 MED ORDER — PROPOFOL 10 MG/ML IV BOLUS
INTRAVENOUS | Status: AC
  Filled 2017-12-14: qty 20

## 2017-12-14 MED ORDER — BUPIVACAINE-EPINEPHRINE (PF) 0.5% -1:200000 IJ SOLN
INTRAMUSCULAR | Status: AC
  Filled 2017-12-14: qty 30

## 2017-12-14 MED ORDER — SUGAMMADEX SODIUM 200 MG/2ML IV SOLN
INTRAVENOUS | Status: DC | PRN
  Administered 2017-12-14: 200 mg via INTRAVENOUS

## 2017-12-14 MED ORDER — KETAMINE HCL 10 MG/ML IJ SOLN
INTRAMUSCULAR | Status: AC
  Filled 2017-12-14: qty 1

## 2017-12-14 MED ORDER — GABAPENTIN 300 MG PO CAPS
300.0000 mg | ORAL_CAPSULE | ORAL | Status: AC
  Administered 2017-12-14: 300 mg via ORAL
  Filled 2017-12-14: qty 1

## 2017-12-14 MED ORDER — MIDAZOLAM HCL 2 MG/2ML IJ SOLN
INTRAMUSCULAR | Status: DC | PRN
  Administered 2017-12-14 (×2): 1 mg via INTRAVENOUS

## 2017-12-14 MED ORDER — SUCCINYLCHOLINE CHLORIDE 200 MG/10ML IV SOSY
PREFILLED_SYRINGE | INTRAVENOUS | Status: AC
  Filled 2017-12-14: qty 10

## 2017-12-14 MED ORDER — ROCURONIUM BROMIDE 10 MG/ML (PF) SYRINGE
PREFILLED_SYRINGE | INTRAVENOUS | Status: AC
  Filled 2017-12-14: qty 5

## 2017-12-14 MED ORDER — LACTATED RINGERS IR SOLN
Status: DC | PRN
  Administered 2017-12-14: 1000 mL

## 2017-12-14 SURGICAL SUPPLY — 48 items
APPLICATOR ARISTA FLEXITIP XL (MISCELLANEOUS) IMPLANT
APPLIER CLIP 5 13 M/L LIGAMAX5 (MISCELLANEOUS) ×3
APPLIER CLIP ROT 10 11.4 M/L (STAPLE)
BANDAGE ADH SHEER 1  50/CT (GAUZE/BANDAGES/DRESSINGS) ×12 IMPLANT
BENZOIN TINCTURE PRP APPL 2/3 (GAUZE/BANDAGES/DRESSINGS) IMPLANT
CABLE HIGH FREQUENCY MONO STRZ (ELECTRODE) ×3 IMPLANT
CHLORAPREP W/TINT 26ML (MISCELLANEOUS) ×3 IMPLANT
CLIP APPLIE 5 13 M/L LIGAMAX5 (MISCELLANEOUS) ×1 IMPLANT
CLIP APPLIE ROT 10 11.4 M/L (STAPLE) IMPLANT
CLIP VESOLOCK MED LG 6/CT (CLIP) IMPLANT
CLOSURE WOUND 1/2 X4 (GAUZE/BANDAGES/DRESSINGS)
COVER MAYO STAND STRL (DRAPES) IMPLANT
COVER SURGICAL LIGHT HANDLE (MISCELLANEOUS) ×3 IMPLANT
DERMABOND ADVANCED (GAUZE/BANDAGES/DRESSINGS) ×2
DERMABOND ADVANCED .7 DNX12 (GAUZE/BANDAGES/DRESSINGS) ×1 IMPLANT
DRAPE C-ARM 42X120 X-RAY (DRAPES) ×3 IMPLANT
DRSG TEGADERM 2-3/8X2-3/4 SM (GAUZE/BANDAGES/DRESSINGS) IMPLANT
ELECT PENCIL ROCKER SW 15FT (MISCELLANEOUS) IMPLANT
ELECT REM PT RETURN 15FT ADLT (MISCELLANEOUS) ×3 IMPLANT
GAUZE SPONGE 2X2 8PLY STRL LF (GAUZE/BANDAGES/DRESSINGS) IMPLANT
GLOVE BIO SURGEON STRL SZ7.5 (GLOVE) ×3 IMPLANT
GLOVE INDICATOR 8.0 STRL GRN (GLOVE) ×3 IMPLANT
GOWN STRL REUS W/TWL XL LVL3 (GOWN DISPOSABLE) ×9 IMPLANT
GRASPER SUT TROCAR 14GX15 (MISCELLANEOUS) IMPLANT
HEMOSTAT ARISTA ABSORB 3G PWDR (MISCELLANEOUS) IMPLANT
HEMOSTAT SNOW SURGICEL 2X4 (HEMOSTASIS) ×3 IMPLANT
KIT BASIN OR (CUSTOM PROCEDURE TRAY) ×3 IMPLANT
L-HOOK LAP DISP 36CM (ELECTROSURGICAL)
LHOOK LAP DISP 36CM (ELECTROSURGICAL) IMPLANT
POUCH RETRIEVAL ECOSAC 10 (ENDOMECHANICALS) ×1 IMPLANT
POUCH RETRIEVAL ECOSAC 10MM (ENDOMECHANICALS) ×2
SCISSORS LAP 5X35 DISP (ENDOMECHANICALS) ×3 IMPLANT
SET CHOLANGIOGRAPH MIX (MISCELLANEOUS) ×3 IMPLANT
SET IRRIG TUBING LAPAROSCOPIC (IRRIGATION / IRRIGATOR) ×3 IMPLANT
SLEEVE XCEL OPT CAN 5 100 (ENDOMECHANICALS) ×6 IMPLANT
SPONGE GAUZE 2X2 STER 10/PKG (GAUZE/BANDAGES/DRESSINGS)
STRIP CLOSURE SKIN 1/2X4 (GAUZE/BANDAGES/DRESSINGS) IMPLANT
SUT MNCRL AB 4-0 PS2 18 (SUTURE) ×3 IMPLANT
SUT VICRYL 0 TIES 12 18 (SUTURE) IMPLANT
SUT VICRYL 0 UR6 27IN ABS (SUTURE) ×3 IMPLANT
TAPE STRIPS DRAPE STRL (GAUZE/BANDAGES/DRESSINGS) ×3 IMPLANT
TOWEL OR 17X26 10 PK STRL BLUE (TOWEL DISPOSABLE) ×3 IMPLANT
TOWEL OR NON WOVEN STRL DISP B (DISPOSABLE) ×3 IMPLANT
TRAY LAPAROSCOPIC (CUSTOM PROCEDURE TRAY) ×3 IMPLANT
TROCAR BLADELESS OPT 5 100 (ENDOMECHANICALS) ×3 IMPLANT
TROCAR XCEL BLUNT TIP 100MML (ENDOMECHANICALS) IMPLANT
TROCAR XCEL NON-BLD 11X100MML (ENDOMECHANICALS) IMPLANT
TUBING INSUF HEATED (TUBING) ×3 IMPLANT

## 2017-12-14 NOTE — Op Note (Signed)
Erin Parker [id number] [birth year] 12/14/2017  Laparoscopic Cholecystectomy with IOC Procedure Note  Indications: This patient presents with symptomatic gallbladder disease and will undergo laparoscopic cholecystectomy.  Pre-operative Diagnosis: symptomatic cholelithiasis  Post-operative Diagnosis: chronic calculous cholecystitis  Surgeon: Gaynelle Adu MD FACS  Assistants: none  Anesthesia: General endotracheal anesthesia   Procedure Details  The patient was seen again in the Holding Room. The risks, benefits, complications, treatment options, and expected outcomes were discussed with the patient. The possibilities of reaction to medication, pulmonary aspiration, perforation of viscus, bleeding, recurrent infection, finding a normal gallbladder, the need for additional procedures, failure to diagnose a condition, the possible need to convert to an open procedure, and creating a complication requiring transfusion or operation were discussed with the patient. The likelihood of improving the patient's symptoms with return to their baseline status is good.  The patient and/or family concurred with the proposed plan, giving informed consent. The site of surgery properly noted. The patient was taken to Operating Room, identified as Erin Parker and the procedure verified as Laparoscopic Cholecystectomy with Intraoperative Cholangiogram. A Time Out was held and the above information confirmed. Antibiotic prophylaxis was administered.   Prior to the induction of general anesthesia, antibiotic prophylaxis was administered. General endotracheal anesthesia was then administered and tolerated well. After the induction, the abdomen was prepped with Chloraprep and draped in the sterile fashion. The patient was positioned in the supine position.  Local anesthetic agent was injected into the skin near the umbilicus and an incision made. We dissected down to the abdominal fascia with blunt  dissection.  The fascia was incised vertically and we entered the peritoneal cavity bluntly.  A pursestring suture of 0-Vicryl was placed around the fascial opening.  The Hasson cannula was inserted and secured with the stay suture.  Pneumoperitoneum was then created with CO2 and tolerated well without any adverse changes in the patient's vital signs. An 5-mm port was placed in the subxiphoid position.  Two 5-mm ports were placed in the right upper quadrant. All skin incisions were infiltrated with a local anesthetic agent before making the incision and placing the trocars.   We positioned the patient in reverse Trendelenburg, tilted slightly to the patient's left.  The patient's left and right hepatic lobe were somewhat invaginating or folding over the gallbladder.  The gallbladder was identified, the fundus grasped and retracted cephalad. Adhesions were lysed bluntly and with the electrocautery where indicated, taking care not to injure any adjacent organs or viscus. The infundibulum was grasped and retracted laterally, exposing the peritoneum overlying the triangle of Calot. This was then divided and exposed in a blunt fashion. A critical view of the cystic duct and cystic artery was obtained.  The cystic duct was clearly identified and bluntly dissected circumferentially. The cystic duct was ligated with a clip distally.   An incision was made in the cystic duct and the [facility] cholangiogram catheter introduced. The catheter was secured using a clip. A cholangiogram was then obtained which showed good visualization of the distal and proximal biliary tree with no sign of filling defects or obstruction.  Contrast flowed easily into the duodenum. The catheter was then removed.   The cystic duct was then ligated with clips and divided. The cystic artery which had been identified & dissected free was ligated with clips and divided as well.   The gallbladder was dissected from the liver bed in retrograde fashion  with the electrocautery.  As a started to dissect the gallbladder from the gallbladder  fossa with electrocautery I came across a small posterior branch of the cystic artery.  This was clipped.  There was some spillage of bile from the gallbladder as it was being mobilized from the liver surface.  The gallbladder was removed and placed in an Ecco sac.  The gallbladder and Ecco sac were then removed through the umbilical port site. The liver bed was irrigated and inspected. Hemostasis was achieved with the electrocautery. Copious irrigation was utilized and was repeatedly aspirated until clear.  We again inspected the right upper quadrant for hemostasis which was present. I did elect to place a piece of surgical SNoW in the gallbladder fossa. The pursestring suture was used to close the umbilical fascia.     The umbilical closure was inspected and there was no air leak and nothing trapped within the closure. Pneumoperitoneum was released as we removed the trocars.  4-0 Monocryl was used to close the skin.   Dermabond was applied. The patient was then extubated and brought to the recovery room in stable condition. Instrument, sponge, and needle counts were correct at closure and at the conclusion of the case.   Findings: Chronic Cholecystitis with Cholelithiasis  Estimated Blood Loss: less than 50 mL         Drains: none         Specimens: Gallbladder           Complications: None; patient tolerated the procedure well.         Disposition: PACU - hemodynamically stable.         Condition: stable  Mary SellaEric M. Andrey CampanileWilson, MD, FACS General, Bariatric, & Minimally Invasive Surgery [facility] [place]

## 2017-12-14 NOTE — Discharge Instructions (Signed)
[facility] LAPAROSCOPIC SURGERY: POST OP INSTRUCTIONS Always review your discharge instruction sheet given to you by the facility where your surgery was performed. IF YOU HAVE DISABILITY OR FAMILY LEAVE FORMS, YOU MUST BRING THEM TO THE OFFICE FOR PROCESSING.   DO NOT GIVE THEM TO YOUR DOCTOR.  1.  First take acetaminophen (Tylenol) AND/or ibuprofen (Advil) for pain. Follow package directions.  If you still have pain after taking both acetaminophen and ibuprofen, you may then take the oxycodone pain pill if needed.   2. If you need a refill on your pain medication, please contact your pharmacy.  They will contact our office to request authorization. Prescriptions will not be filled after 5pm or on week-ends. 3. You should follow a light diet the first few days after arrival home, such as soup and crackers, etc.  Be sure to include lots of fluids daily. 4. Most patients will experience some swelling and bruising in the area of the incisions.  Ice packs will help.  Swelling and bruising can take several days to resolve.  5. It is common to experience some constipation if taking pain medication after surgery.  Increasing fluid intake and taking a stool softener (such as Colace) will usually help or prevent this problem from occurring.  A mild laxative (Milk of Magnesia or Miralax) should be taken according to package instructions if there are no bowel movements after 48 hours. 6.  If your surgeon used skin glue on the incision, you may shower in 24 hours.  The glue will flake off over the next 2-3 weeks.  Any sutures or staples will be removed at the office during your follow-up visit. 7. ACTIVITIES:  You may resume regular (light) daily activities beginning the next day--such as daily self-care, walking, climbing stairs--gradually increasing activities as tolerated.  You may have sexual intercourse when it is comfortable.  Refrain from any heavy lifting or straining until approved  by your doctor. a. You may drive when you are no longer taking prescription pain medication, you can comfortably wear a seatbelt, and you can safely maneuver your car and apply brakes. 8. You should see your doctor in the office for a follow-up appointment approximately 2-3 weeks after your surgery.  Make sure that you call for this appointment within a day or two after you arrive home to insure a convenient appointment time. 9. OTHER INSTRUCTIONS:  WHEN TO CALL YOUR DOCTOR: 1. Fever over 101.0 2. Inability to urinate 3. Continued bleeding from incision. 4. Increased pain, redness, or drainage from the incision. 5. Increasing abdominal pain  The clinic staff is available to answer your questions during regular business hours.  Please dont hesitate to call and ask to speak to one of the nurses for clinical concerns.  If you have a medical emergency, go to the nearest emergency room or call 911.  A surgeon from [facility] is always on call at the hospital. [address], [address], [place], [place]  [address] ? [address] [address], [place], [place]   [address] [phone] ? [phone] ? FAX [phone] Web site: [email or web address]   Colecistectoma laparoscpica, cuidados posteriores Laparoscopic Cholecystectomy, Care After Lea esta informacin sobre cmo cuidarse despus del procedimiento. El mdico tambin podr darle indicaciones ms especficas. Si tiene problemas o preguntas, llame al mdico. Siga estas indicaciones en su casa: Cuidado de los cortes de la ciruga (incisiones)   Siga las indicaciones del mdico en lo que respecta al cuidado de los cortes  de la [place]. Haga lo siguiente: ? Lvese las manos con agua y jabn antes de Multimedia programmer las vendas (vendaje). Use un desinfectante para manos si no dispone de [place] y [place]. ? Cambie el vendaje como se lo haya indicado el mdico. ? No retire los puntos (suturas), la goma para cerrar la piel o las tiras  [place]. Tal vez deban dejarse puestos en la piel durante 2semanas o ms tiempo. Si las tiras [place] se despegan y se enroscan, puede recortar los bordes sueltos. No retire las tiras [employer] por completo a menos que el mdico lo autorice.  No tome baos de inmersin, no practique natacin ni use el jacuzzi hasta que el mdico lo autorice. Pregntele al mdico si puede ducharse. Delle Reining solo le permitan tomar baos de [place].  Controle la zona alrededor del corte todos los das para detectar signos de infeccin. Est atento a los siguientes signos: ? Aumento del enrojecimiento, de la hinchazn o del dolor. ? Ms lquido [place]. ? Calor. ? Pus o mal olor. Actividad  No conduzca ni use maquinaria pesada mientras toma analgsicos recetados.  No levante ningn objeto que pese ms de 10libras (4,5kg) hasta que el mdico lo autorice.  No practique deportes de contacto hasta que el mdico lo autorice.  No conduzca durante 24horas si le dieron un medicamento para ayudarlo a que se relaje (sedante).  Descanse todo lo que sea necesario. No retome el trabajo ni el estudio hasta que el mdico lo autorice. Instrucciones generales  [employer] de venta libre y los recetados solamente como se lo haya indicado el mdico.  A fin de prevenir o tratar el estreimiento mientras toma analgsicos recetados, el mdico puede recomendarle lo siguiente: ? Product manager suficiente lquido para Pharmacologist el pis (orina) claro o de color amarillo plido. ? Tomar medicamentos recetados o de [employer]. ? Consumir alimentos ricos en fibra, como frutas y verduras frescas, cereales integrales y frijoles. ? Limitar el consumo de alimentos con alto contenido de grasas y azcares procesados, como alimentos fritos o dulces. Comunquese con un mdico si:  Le aparece una erupcin cutnea.  Tiene ms enrojecimiento, hinchazn o dolor alrededor [employer] cortes de la [place].  Aumenta la cantidad de lquido o  sangre que sale de los cortes.  Los cortes de la ciruga se sienten calientes al tacto.  Tiene pus o percibe mal olor que [employer] cortes.  Tiene fiebre.  Se abren uno o ms de los cortes. Solicite ayuda de inmediato si:  Tiene dificultad para respirar.  Siente dolor en el pecho.  Siente dolor que empeora en la zona de los hombros.  Se desmaya o se siente mareado al ponerse de pie.  Tiene dolor muy intenso de vientre (abdomen).  Tiene malestar estomacal (nuseas) que dura ms de Civil engineer, contracting.  Vomita durante ms de Civil engineer, contracting.  Siente dolor en la pierna. Esta informacin no tiene Theme park manager el consejo del mdico. Asegrese de hacerle al mdico cualquier pregunta que tenga. Document Released: 05/31/2011 Document Revised: 12/25/2016 Document Reviewed: 03/06/2016 Elsevier Interactive Patient Education  [employer].

## 2017-12-14 NOTE — Anesthesia Procedure Notes (Signed)
Date/Time: 12/14/2017 9:02 AM Performed by: Minerva EndsMirarchi, Terri Malerba M, CRNA Oxygen Delivery Method: Simple face mask

## 2017-12-14 NOTE — Interval H&P Note (Signed)
History and Physical Interval Note:  12/14/2017 7:23 AM  Erin Parker  has presented today for surgery, with the diagnosis of SYMPTOMATIC CHOLELITHIASIS  The various methods of treatment have been discussed with the patient and family. After consideration of risks, benefits and other options for treatment, the patient has consented to  Procedure(s): LAPAROSCOPIC CHOLECYSTECTOMY WITH INTRAOPERATIVE CHOLANGIOGRAM (N/A) as a surgical intervention .  The patient's history has been reviewed, patient examined, no change in status, stable for surgery.  I have reviewed the patient's chart and labs.  Questions were answered to the patient's satisfaction.    Mary SellaEric M. Andrey CampanileWilson, MD, FACS General, Bariatric, & Minimally Invasive Surgery [facility]   Gaynelle AduEric Maty Zeisler

## 2017-12-14 NOTE — Progress Notes (Signed)
Discharge instructions reviewed with patient and her husband Remi DeterSamuel with Bah[place] interpreter, Hoover BrunetteGrecia, present for translation. Both verbalize understanding of all teaching, questions answered. Copies of discharge instructions in Spanish provided and rx provided.

## 2017-12-14 NOTE — Progress Notes (Signed)
Interpreter at bedside.

## 2017-12-14 NOTE — Anesthesia Procedure Notes (Signed)
Procedure Name: Intubation Date/Time: 12/14/2017 7:37 AM Performed by: Minerva EndsMirarchi, Sona Nations M, CRNA Pre-anesthesia Checklist: Patient identified, Emergency Drugs available, Suction available and Patient being monitored Patient Re-evaluated:Patient Re-evaluated prior to induction Oxygen Delivery Method: Circle System Utilized Preoxygenation: Pre-oxygenation with 100% oxygen Induction Type: IV induction Ventilation: Mask ventilation without difficulty Laryngoscope Size: Miller and 2 Grade View: Grade I Tube type: Oral Number of attempts: 1 Airway Equipment and Method: Stylet and Oral airway Placement Confirmation: ETT inserted through vocal cords under direct vision,  positive ETCO2 and breath sounds checked- equal and bilateral Secured at: 22 cm Tube secured with: Tape Dental Injury: Teeth and Oropharynx as per pre-operative assessment  Comments: Smooth IV induction Green -- intubation AM CRNA atraumatic-- teeth and mouth as preop-- bilat BS Green

## 2017-12-14 NOTE — Transfer of Care (Signed)
Immediate Anesthesia Transfer of Care Note  Patient: Erin Parker  Procedure(s) Performed: LAPAROSCOPIC CHOLECYSTECTOMY WITH INTRAOPERATIVE CHOLANGIOGRAM (N/A )  Patient Location: PACU  Anesthesia Type:General  Level of Consciousness: sedated  Airway & Oxygen Therapy: Patient Spontanous Breathing and Patient connected to face mask oxygen  Post-op Assessment: Report given to RN and Post -op Vital signs reviewed and stable  Post vital signs: Reviewed and stable  Last Vitals:  Vitals:   12/14/17 0554  BP: 117/74  Pulse: 65  Resp: 16  Temp: 36.9 C  SpO2: 100%    Last Pain:  Vitals:   12/14/17 0554  TempSrc: Oral         Complications: No apparent anesthesia complications

## 2017-12-14 NOTE — Anesthesia Postprocedure Evaluation (Signed)
Anesthesia Post Note  Patient: Erin Parker  Procedure(s) Performed: LAPAROSCOPIC CHOLECYSTECTOMY WITH INTRAOPERATIVE CHOLANGIOGRAM (N/A )     Patient location during evaluation: PACU Anesthesia Type: General Level of consciousness: awake Pain management: pain level controlled Vital Signs Assessment: post-procedure vital signs reviewed and stable Respiratory status: spontaneous breathing Cardiovascular status: stable Anesthetic complications: no    Last Vitals:  Vitals:   12/14/17 0915 12/14/17 0930  BP: 111/72 110/74  Pulse: 79 69  Resp: 18 12  Temp:    SpO2: 100% 100%    Last Pain:  Vitals:   12/14/17 0915  TempSrc:   PainSc: Asleep                 Garion Wempe

## 2017-12-14 NOTE — Anesthesia Preprocedure Evaluation (Addendum)
Anesthesia Evaluation  Patient identified by MRN, date of birth, ID band Patient awake    Reviewed: Allergy & Precautions, NPO status , Patient's Chart, lab work & pertinent test results  Airway Mallampati: II  TM Distance: >3 FB     Dental no notable dental hx. (+) Dental Advisory Given   Pulmonary    breath sounds clear to auscultation       Cardiovascular negative cardio ROS   Rhythm:Regular Rate:Normal     Neuro/Psych  Headaches,    GI/Hepatic Neg liver ROS, GERD  ,  Endo/Other  negative endocrine ROS  Renal/GU negative Renal ROS     Musculoskeletal   Abdominal   Peds  Hematology   Anesthesia Other Findings   Reproductive/Obstetrics                           Anesthesia Physical Anesthesia Plan  ASA: III  Anesthesia Plan: General   Post-op Pain Management:    Induction: Intravenous  PONV Risk Score and Plan: 3 and Treatment may vary due to age or medical condition, Ondansetron, Dexamethasone and Midazolam  Airway Management Planned: Mask, Natural Airway and Nasal Cannula  Additional Equipment:   Intra-op Plan:   Post-operative Plan: Extubation in OR  Informed Consent: I have reviewed the patients History and Physical, chart, labs and discussed the procedure including the risks, benefits and alternatives for the proposed anesthesia with the patient or authorized representative who has indicated his/her understanding and acceptance.   Dental advisory given  Plan Discussed with: CRNA and Anesthesiologist  Anesthesia Plan Comments:       Anesthesia Quick Evaluation

## 2017-12-15 ENCOUNTER — Encounter (HOSPITAL_COMMUNITY): Payer: Self-pay | Admitting: General Surgery

## 2018-12-24 IMAGING — US US ABDOMEN LIMITED
1 series · 14 of 25 positions shown · non-contrast
Comparison: Abdominal ultrasound performed 08/09/2009

CLINICAL DATA: Acute onset of right upper quadrant postprandial
pain.

EXAM:
ULTRASOUND ABDOMEN LIMITED RIGHT UPPER QUADRANT

[Series 1: us abdomen limited · 0.26mm/px · 14 of 47 slices shown]
[im 1/47]
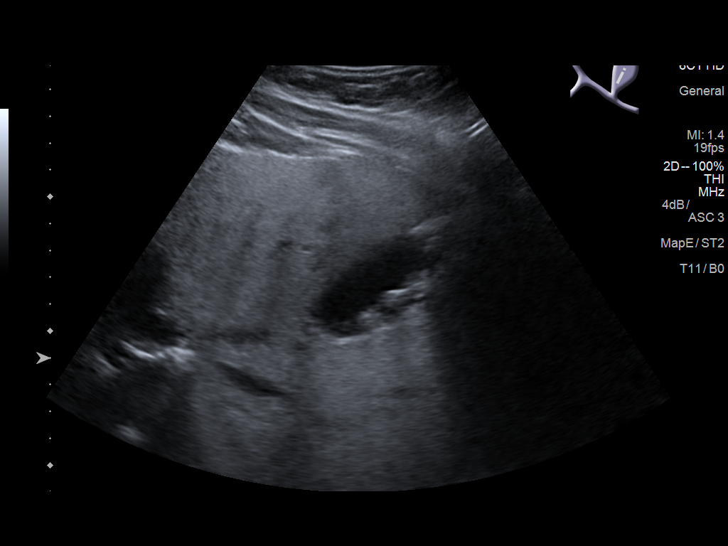
[im 4/47]
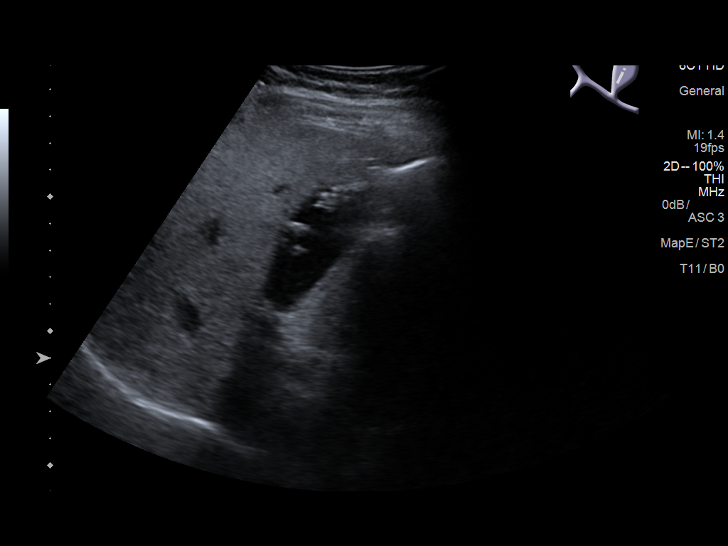
[im 8/47]
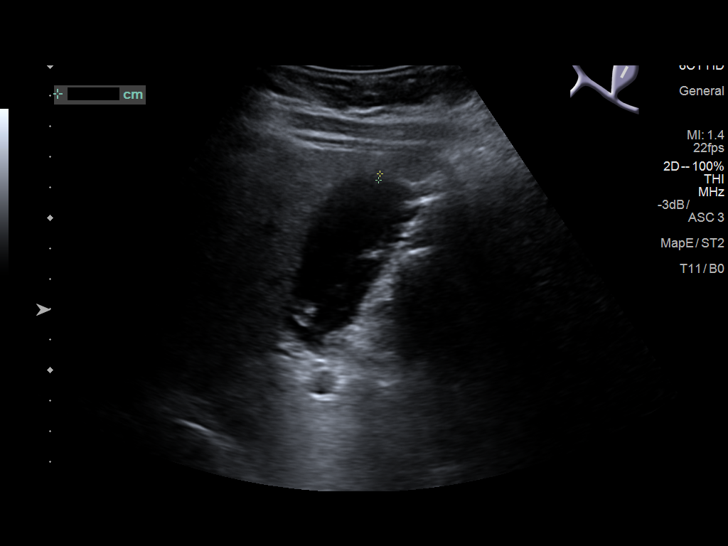
[im 12/47]
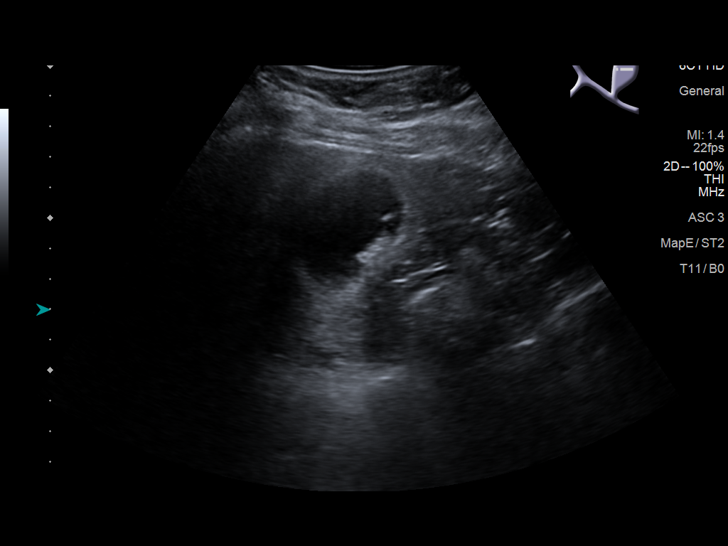
[im 16/47]
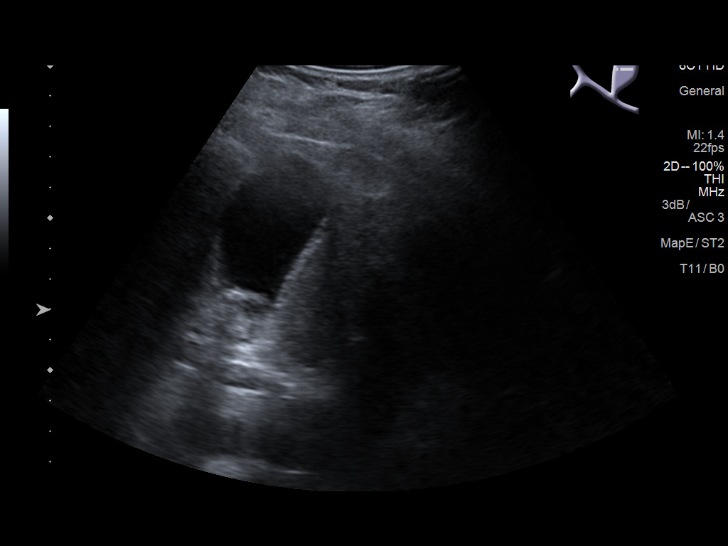
[im 18/47]
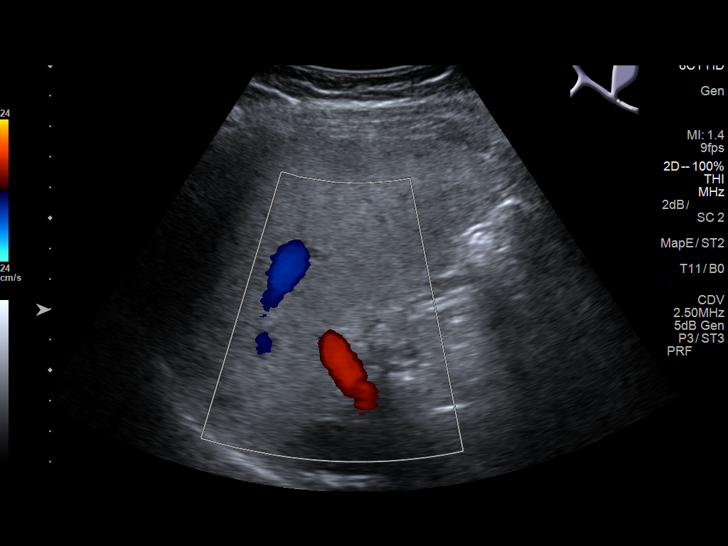
[im 22/47]
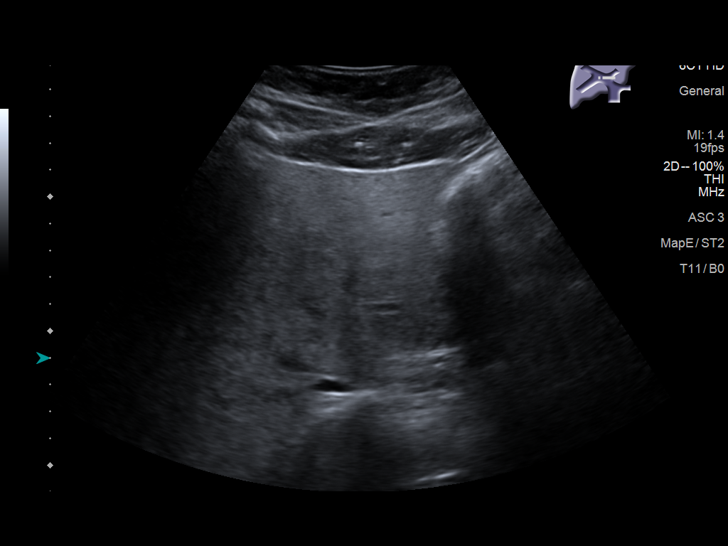
[im 25/47]
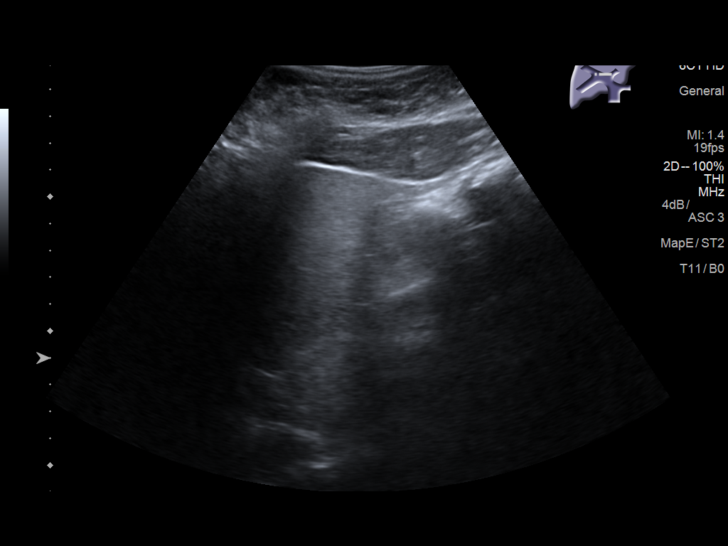
[im 29/47]
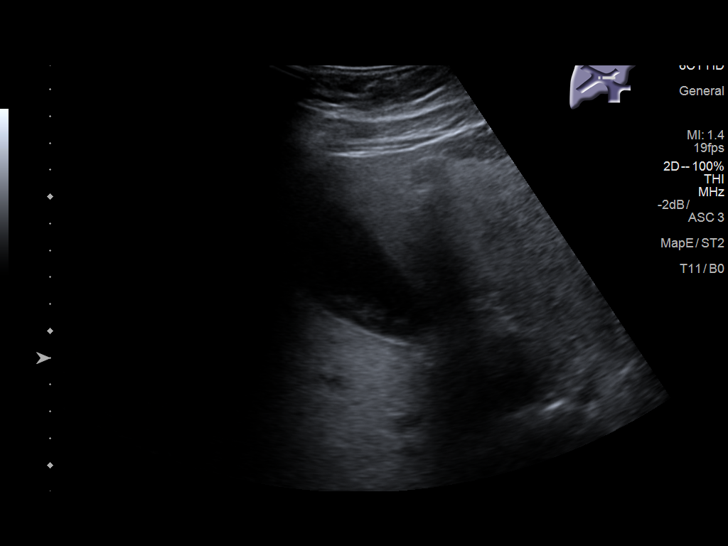
[im 31/47]
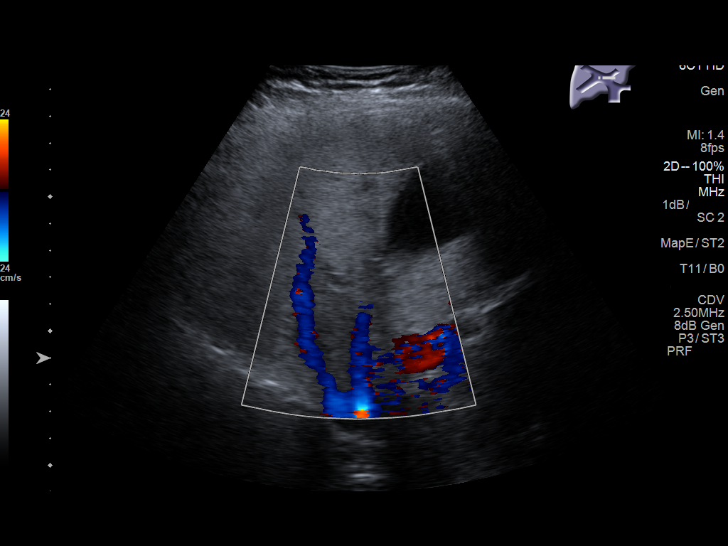
[im 35/47]
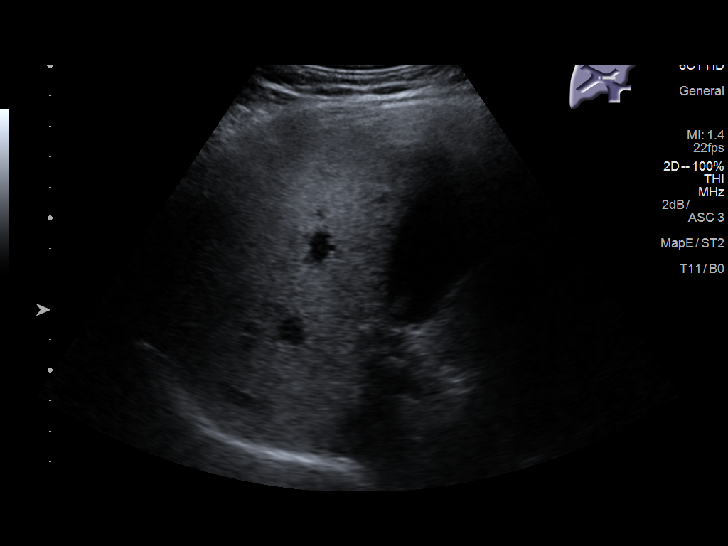
[im 39/47]
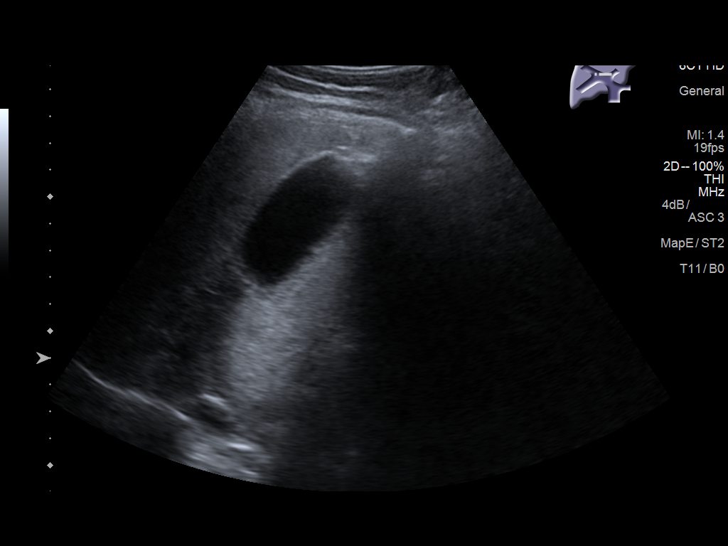
[im 43/47]
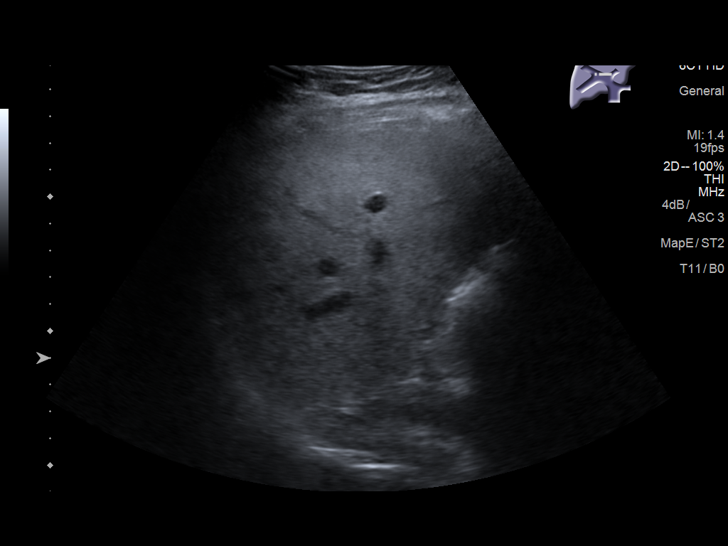
[im 47/47]
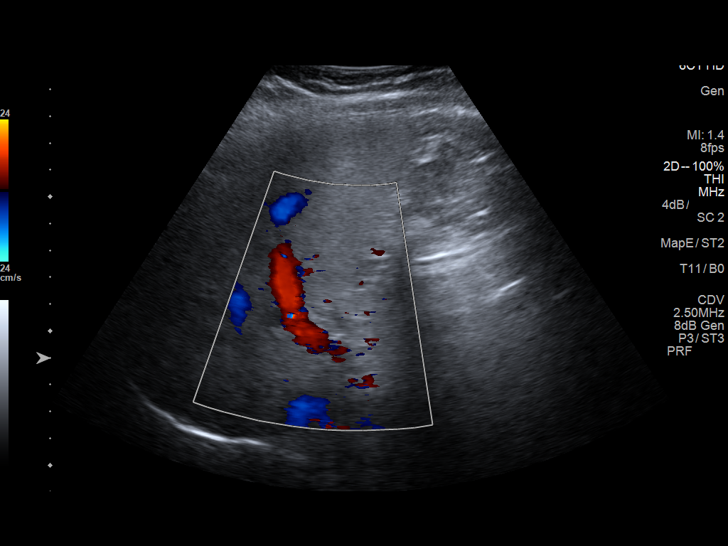

[14 of 25 positions shown; findings below may reference images not displayed]

FINDINGS: Gallbladder:

Stones are seen dependently within the gallbladder, measuring up to
1.1 cm in size. No gallbladder wall thickening or pericholecystic
fluid is seen. No ultrasonographic Murphy's sign is elicited.

Common bile duct:

Diameter: 0.4 cm, within normal limits in caliber.

Liver:

No focal lesion identified. Diffusely increased parenchymal
echogenicity and coarsened echotexture, compatible with fatty
infiltration. Portal vein is patent on color Doppler imaging with
normal direction of blood flow towards the liver.
IMPRESSION: 1. Cholelithiasis.  Gallbladder otherwise unremarkable.
2. Diffuse fatty infiltration within the liver.

## 2019-06-21 IMAGING — RF DG CHOLANGIOGRAM OPERATIVE
1 series · 4 of 4 positions shown · non-contrast
Comparison: Right upper quadrant abdominal ultrasound on 08/07/2017

CLINICAL DATA: Cholecystectomy for symptomatic cholelithiasis.

EXAM:
INTRAOPERATIVE CHOLANGIOGRAM
TECHNIQUE: Cholangiographic images from the C-arm fluoroscopic device were
submitted for interpretation post-operatively. Please see the
procedural report for the amount of contrast and the fluoroscopy
time utilized.

[Series 1: run · 4 of 41 frames shown]
[frame 7/41]
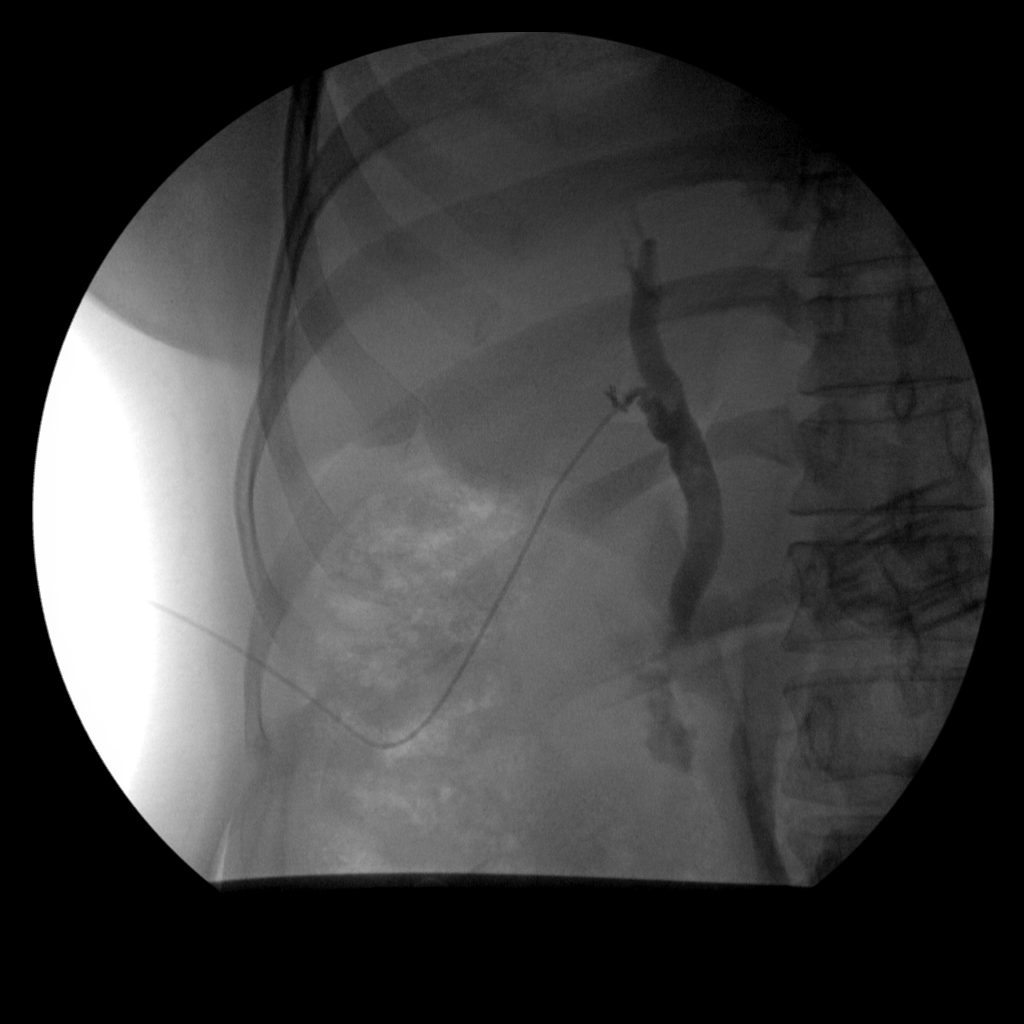
[frame 21/41]
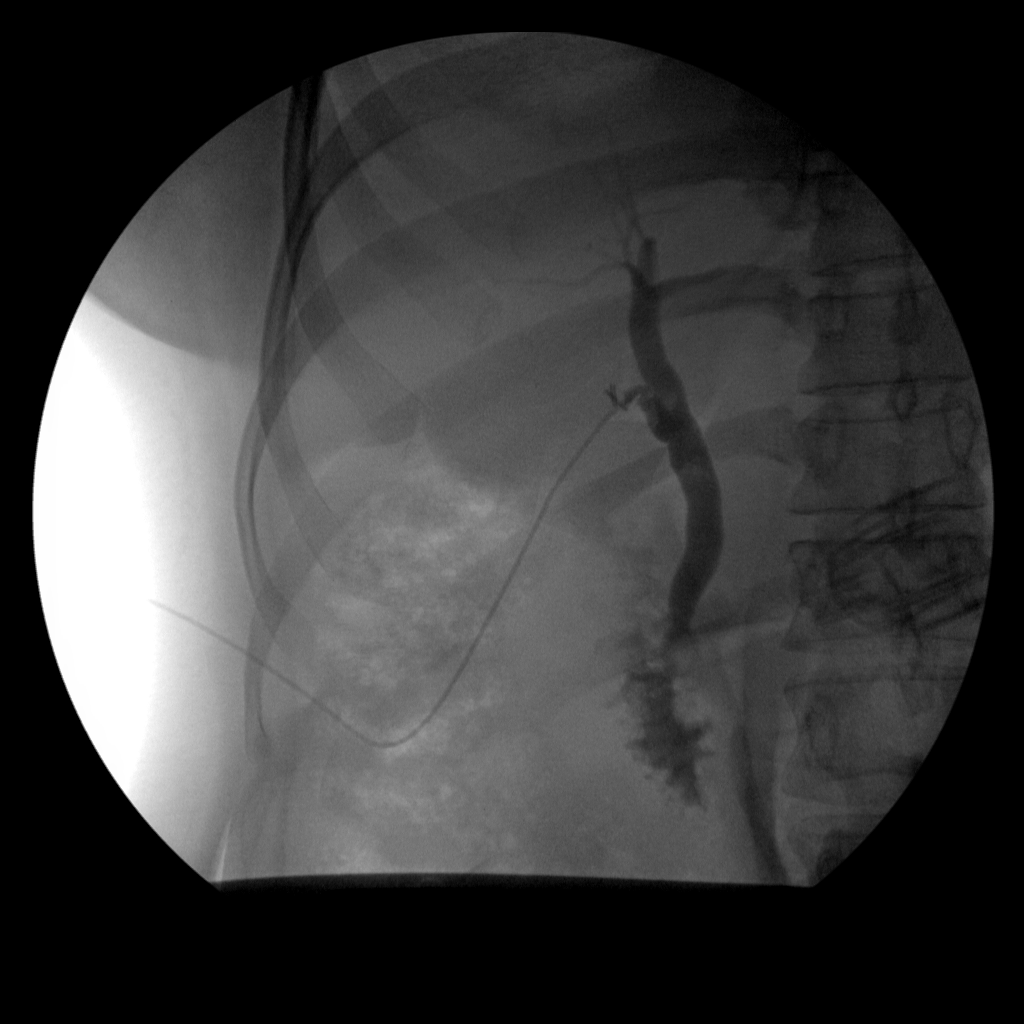
[frame 35/41]
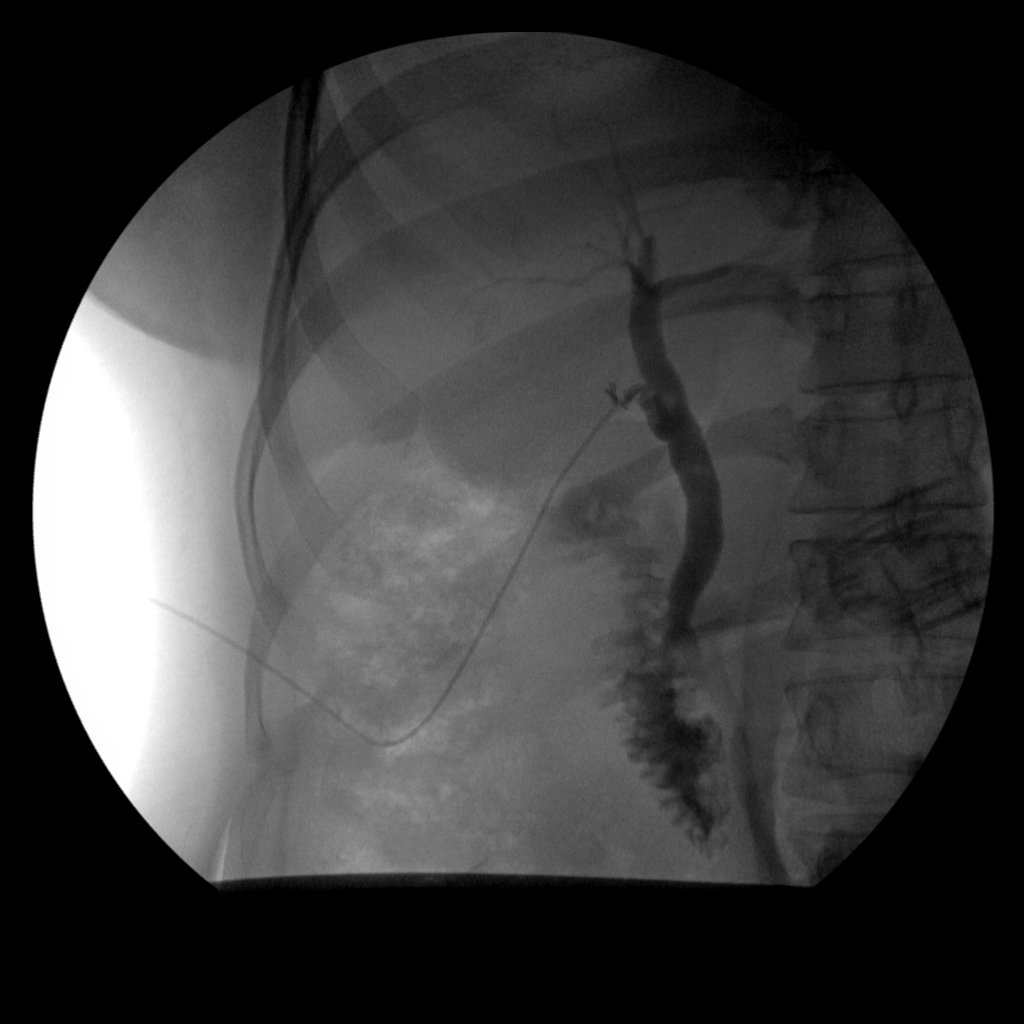
[frame 41/41]
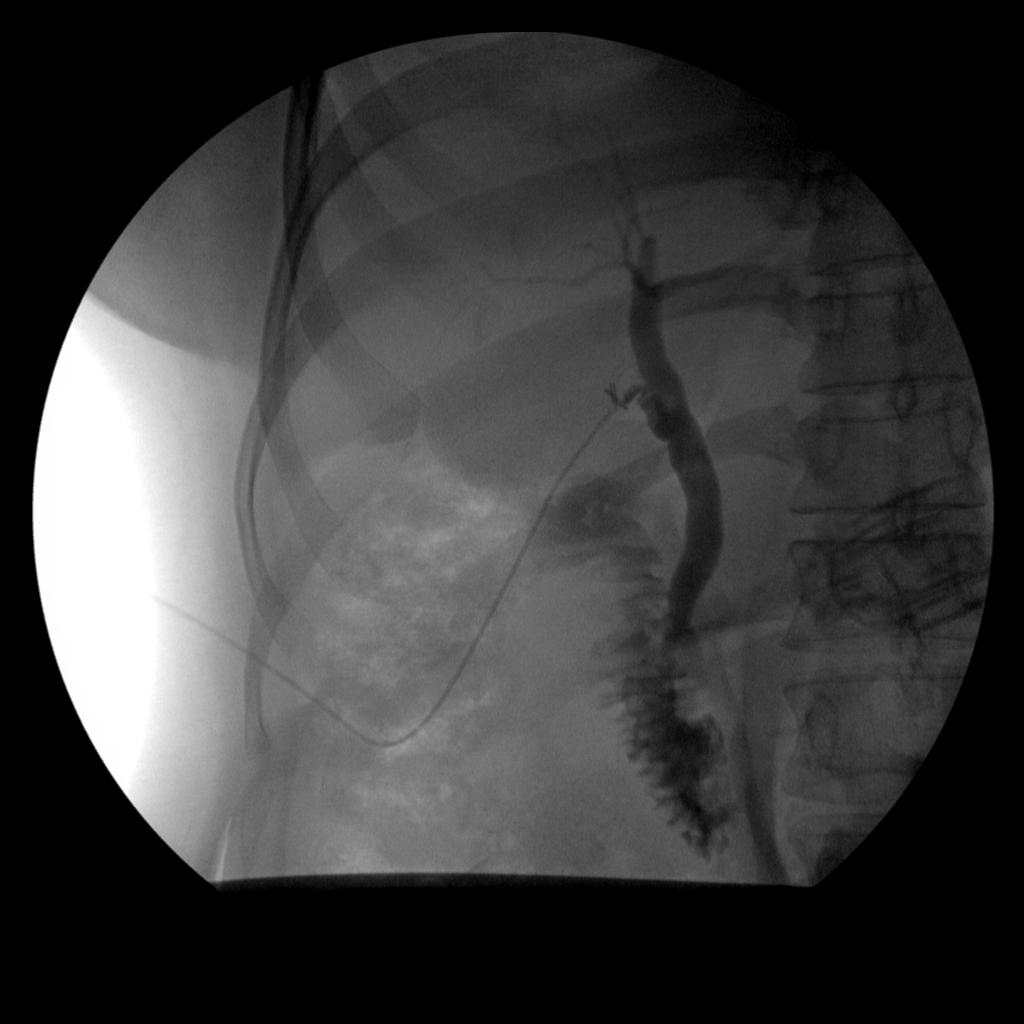

[4 of 4 positions shown; findings below may reference images not displayed]

FINDINGS: Intraoperative cholangiogram performed with a C-arm demonstrates a
normal opacified biliary tree. There is some streaming of contrast
in the common bile duct without a persistent discrete filling
defect. Contrast enters the duodenum normally. No evidence of
contrast extravasation.
IMPRESSION: Unremarkable intraoperative cholangiogram.

## 2019-06-30 ENCOUNTER — Ambulatory Visit: Payer: Self-pay | Attending: Internal Medicine

## 2019-06-30 ENCOUNTER — Other Ambulatory Visit: Payer: Self-pay

## 2019-06-30 ENCOUNTER — Telehealth: Payer: Self-pay | Admitting: Internal Medicine
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
## 2019-07-01 ENCOUNTER — Ambulatory Visit: Payer: Self-pay | Admitting: Family Medicine

## 2019-07-18 ENCOUNTER — Ambulatory Visit: Payer: Self-pay | Admitting: Family Medicine

## 2019-10-10 ENCOUNTER — Ambulatory Visit: Payer: Self-pay | Attending: Internal Medicine

## 2019-10-10 DIAGNOSIS — Z20822 Contact with and (suspected) exposure to covid-19: Secondary | ICD-10-CM

## 2019-10-10 DIAGNOSIS — U071 COVID-19: Secondary | ICD-10-CM | POA: Insufficient documentation

## 2019-10-12 LAB — NOVEL CORONAVIRUS, NAA: SARS-CoV-2, NAA: DETECTED — AB

## 2019-10-14 ENCOUNTER — Telehealth: Payer: Self-pay

## 2019-10-14 NOTE — Telephone Encounter (Signed)
Using [employer], [place]; [place] # [phone], attempted to return call to pt.  Left vm to return call to [facility] @ [phone] to discuss COVID test results.  Able to leave vm at [phone].  The mobile number listed, [phone] was a nonworking number.

## 2019-10-14 NOTE — Telephone Encounter (Signed)
Pt called to get test results. Explained that we would have a nurse call back with an update.   Pt has two profiles. MRN: [id number] should be the primary profile. Name, DOB, address and phone were verified.   Erin Parker

## 2019-10-17 ENCOUNTER — Telehealth: Payer: Self-pay

## 2019-10-17 NOTE — Telephone Encounter (Signed)
Pt returning call to get covid test results.   Erin Parker

## 2019-10-17 NOTE — Telephone Encounter (Signed)
Called patient using interpreter- [phone] Patient notified of + COVID result. Patient test due to symptoms: cough,fever and body aches. Patient advised to treat symptoms as needed OTC, contact PCP for follow up and go to ED for trouble breathing ,dehydration or severe weakness. Advised to isolate and safe precautions in the home reviewed. CDC criteria for ending isolation given.

## 2020-11-02 ENCOUNTER — Ambulatory Visit: Payer: Self-pay | Attending: Internal Medicine

## 2020-11-02 DIAGNOSIS — Z23 Encounter for immunization: Secondary | ICD-10-CM

## 2020-11-02 NOTE — Progress Notes (Signed)
   Covid-19 Vaccination Clinic  Name:  Amalya Salmons    MRN: [id number] DOB: 29-Dec-1980  11/02/2020  Ms. Dani Gobble was observed post Covid-19 immunization for 15 minutes .  During the observation period, she experienced an adverse reaction with the following symptoms:  dizziness.  Assessment : Time of assessment . Alert and oriented.  Actions taken: BP 100/70 Pulse 66 AR 100 Vitals sign taken   There were no vitals filed for this visit.  Medications administered: No medication administered.  Disposition:Instructed to call 911 for trouble breathing, rapid heart rate, dizziness, swelling of tongue or throat. Patient requested to go home.Discharged home with family   Immunizations Administered    Name Date Dose VIS Date Route   PFIZER Comrnaty(Gray TOP) Covid-19 Vaccine 11/02/2020  3:56 PM 0.3 mL 09/09/2020 Intramuscular   Manufacturer: [employer], [employer]   Lot: [id number]   NDC: [phone]

## 2021-01-20 ENCOUNTER — Emergency Department (HOSPITAL_COMMUNITY)
Admission: EM | Admit: 2021-01-20 | Discharge: 2021-01-21 | Disposition: A | Discharge status: Home / Self Care | Payer: No Typology Code available for payment source | Attending: Emergency Medicine | Admitting: Emergency Medicine

## 2021-01-20 ENCOUNTER — Emergency Department (HOSPITAL_COMMUNITY): Payer: No Typology Code available for payment source

## 2021-01-20 DIAGNOSIS — R1084 Generalized abdominal pain: Secondary | ICD-10-CM | POA: Diagnosis not present

## 2021-01-20 DIAGNOSIS — Z20822 Contact with and (suspected) exposure to covid-19: Secondary | ICD-10-CM | POA: Insufficient documentation

## 2021-01-20 DIAGNOSIS — M546 Pain in thoracic spine: Secondary | ICD-10-CM | POA: Insufficient documentation

## 2021-01-20 DIAGNOSIS — S40011A Contusion of right shoulder, initial encounter: Secondary | ICD-10-CM | POA: Insufficient documentation

## 2021-01-20 DIAGNOSIS — T07XXXA Unspecified multiple injuries, initial encounter: Secondary | ICD-10-CM

## 2021-01-20 DIAGNOSIS — M545 Low back pain, unspecified: Secondary | ICD-10-CM | POA: Insufficient documentation

## 2021-01-20 DIAGNOSIS — Y92410 Unspecified street and highway as the place of occurrence of the external cause: Secondary | ICD-10-CM | POA: Diagnosis not present

## 2021-01-20 LAB — PROTIME-INR
INR: 0.9 (ref 0.8–1.2)
Prothrombin Time: 12.4 seconds (ref 11.4–15.2)

## 2021-01-20 LAB — COMPREHENSIVE METABOLIC PANEL
ALT: 19 U/L (ref 0–44)
AST: 24 U/L (ref 15–41)
Albumin: 4.2 g/dL (ref 3.5–5.0)
Alkaline Phosphatase: 68 U/L (ref 38–126)
Anion gap: 8 (ref 5–15)
BUN: 11 mg/dL (ref 6–20)
CO2: 26 mmol/L (ref 22–32)
Calcium: 9.1 mg/dL (ref 8.9–10.3)
Chloride: 105 mmol/L (ref 98–111)
Creatinine, Ser: 0.46 mg/dL (ref 0.44–1.00)
GFR, Estimated: >60 mL/min (ref >60)
Glucose, Bld: 104 mg/dL — ABNORMAL HIGH (ref 70–99)
Potassium: 3.6 mmol/L (ref 3.5–5.1)
Sodium: 139 mmol/L (ref 135–145)
Total Bilirubin: 0.5 mg/dL (ref 0.3–1.2)
Total Protein: 7.1 g/dL (ref 6.5–8.1)

## 2021-01-20 LAB — I-STAT CHEM 8, ED
BUN: 12 mg/dL (ref 6–20)
Calcium, Ion: 1.2 mmol/L (ref 1.15–1.40)
Chloride: 105 mmol/L (ref 98–111)
Creatinine, Ser: 0.4 mg/dL — ABNORMAL LOW (ref 0.44–1.00)
Glucose, Bld: 100 mg/dL — ABNORMAL HIGH (ref 70–99)
HCT: 38 % (ref 36.0–46.0)
Hemoglobin: 12.9 g/dL (ref 12.0–15.0)
Potassium: 3.6 mmol/L (ref 3.5–5.1)
Sodium: 142 mmol/L (ref 135–145)
TCO2: 28 mmol/L (ref 22–32)

## 2021-01-20 LAB — CBC
HCT: 38 % (ref 36.0–46.0)
Hemoglobin: 13 g/dL (ref 12.0–15.0)
MCH: 31 pg (ref 26.0–34.0)
MCHC: 34.2 g/dL (ref 30.0–36.0)
MCV: 90.7 fL (ref 80.0–100.0)
Platelets: 211 10*3/uL (ref 150–400)
RBC: 4.19 MIL/uL (ref 3.87–5.11)
RDW: 13 % (ref 11.5–15.5)
WBC: 7.4 10*3/uL (ref 4.0–10.5)
nRBC: 0 % (ref 0.0–0.2)

## 2021-01-20 LAB — URINALYSIS, ROUTINE W REFLEX MICROSCOPIC
Bilirubin Urine: NEGATIVE
Glucose, UA: NEGATIVE mg/dL
Hgb urine dipstick: NEGATIVE
Ketones, ur: NEGATIVE mg/dL
Nitrite: NEGATIVE
Protein, ur: NEGATIVE mg/dL
Specific Gravity, Urine: 1.01 (ref 1.005–1.030)
pH: 9 — ABNORMAL HIGH (ref 5.0–8.0)

## 2021-01-20 LAB — I-STAT BETA HCG BLOOD, ED (MC, WL, AP ONLY): I-stat hCG, quantitative: <5 m[IU]/mL (ref <5)

## 2021-01-20 LAB — LACTIC ACID, PLASMA: Lactic Acid, Venous: 1.5 mmol/L (ref 0.5–1.9)

## 2021-01-20 LAB — ETHANOL: Alcohol, Ethyl (B): <10 mg/dL (ref <10)

## 2021-01-20 MED ORDER — FENTANYL CITRATE (PF) 100 MCG/2ML IJ SOLN
50.0000 ug | Freq: Once | INTRAMUSCULAR | Status: AC
  Administered 2021-01-20: 50 ug via INTRAVENOUS
  Filled 2021-01-20: qty 2

## 2021-01-20 MED ORDER — IOHEXOL 300 MG/ML  SOLN
100.0000 mL | Freq: Once | INTRAMUSCULAR | Status: AC | PRN
  Administered 2021-01-20: 100 mL via INTRAVENOUS
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
## 2021-01-21 LAB — RESP PANEL BY RT-PCR (FLU A&B, COVID) ARPGX2
Influenza A by PCR: NEGATIVE
Influenza B by PCR: NEGATIVE
SARS Coronavirus 2 by RT PCR: NEGATIVE

## 2021-01-21 MED ORDER — OXYCODONE HCL 5 MG PO TABS: 5.0000 mg | ORAL_TABLET | ORAL | 0 refills | Status: DC | PRN

## 2021-01-21 NOTE — Discharge Instructions (Signed)
Aplique hielo durante treinta minutos a la vez, cuatro veces al [employer].  Tome paracetamol y/o ibuprofeno segn sea necesario para [employer].

## 2023-03-12 ENCOUNTER — Other Ambulatory Visit: Payer: Self-pay

## 2023-03-12 DIAGNOSIS — N6331 Unspecified lump in axillary tail of the right breast: Secondary | ICD-10-CM

## 2023-05-03 ENCOUNTER — Ambulatory Visit
Admission: RE | Admit: 2023-05-03 | Discharge: 2023-05-03 | Disposition: A | Discharge status: Home / Self Care | Payer: No Typology Code available for payment source | Source: Ambulatory Visit | Attending: Obstetrics and Gynecology | Admitting: Obstetrics and Gynecology

## 2023-05-03 ENCOUNTER — Ambulatory Visit: Payer: Self-pay | Admitting: Hematology and Oncology

## 2023-05-03 ENCOUNTER — Other Ambulatory Visit: Payer: Self-pay | Admitting: Obstetrics and Gynecology

## 2023-05-03 ENCOUNTER — Other Ambulatory Visit (HOSPITAL_COMMUNITY): Payer: Self-pay

## 2023-05-03 ENCOUNTER — Other Ambulatory Visit: Payer: Self-pay

## 2023-05-03 VITALS — BP 112/77 | Wt 139.0 lb

## 2023-05-03 DIAGNOSIS — N6331 Unspecified lump in axillary tail of the right breast: Secondary | ICD-10-CM

## 2023-05-03 MED ORDER — METRONIDAZOLE 500 MG PO TABS
500.0000 mg | ORAL_TABLET | Freq: Two times a day (BID) | ORAL | 0 refills | Status: AC
  Filled 2023-05-03: qty 14, 7d supply, fill #0

## 2023-05-03 NOTE — Patient Instructions (Signed)
Taught Erin Parker about self breast awareness and gave educational materials to take home. Patient did not need a Pap smear today due to last Pap smear was in 09/06/2022 per patient. Let her know [facility] will cover Pap smears every 5 years unless has a history of abnormal Pap smears. Referred patient to the [facility] for diagnostic mammogram. Appointment scheduled for 05/03/2023. Patient aware of appointment and will be there. Let patient know will follow up with her within the next couple weeks with results. Erin Parker verbalized understanding.  Pascal Lux, NP 11:25 AM

## 2023-05-03 NOTE — Progress Notes (Signed)
[name] is a 41 y.o. female who presents to [facility] clinic today with no complaints.    Pap Smear: Pap not smear completed today. Last Pap smear was 09/06/2022 and was normal. Per patient has no history of an abnormal Pap smear. Last Pap smear result is not available in Epic.   Physical exam: Breasts Breasts symmetrical. No skin abnormalities bilateral breasts. No nipple retraction bilateral breasts. No nipple discharge bilateral breasts. No lymphadenopathy. No lumps palpated bilateral breasts.       Pelvic/Bimanual Pap is not indicated today    Smoking History: Patient has never smoked and was not referred to quit line.    Patient Navigation: Patient education provided. Access to services provided for patient through [facility] program. Erin Parker interpreter provided. No transportation provided   Colorectal Cancer Screening: Per patient has never had colonoscopy completed No complaints today.    Breast and Cervical Cancer Risk Assessment: Patient does not have family history of breast cancer, known genetic mutations, or radiation treatment to the chest before age 34. Patient does not have history of cervical dysplasia, immunocompromised, or DES exposure in-utero.  Risk Scores as of Encounter on 05/03/2023     Erin Parker           5-year 0.48%   Lifetime 7.2%   This patient is [place] but has no documented birth country, so the [place] model used data from [place] patients to calculate their risk score. Document a birth country in the Demographics activity for a more accurate score.         Last calculated by Erin Parker, CMA on 05/03/2023 at 11:06 AM        A: [facility] exam without pap smear Complaint of right axillary lump. Benign exam.   P: Referred patient to the [facility] for a screening mammogram. Appointment scheduled 05/03/2023.  Erin Parker A, NP 05/03/2023 11:22 AM

## 2023-05-04 ENCOUNTER — Other Ambulatory Visit: Payer: Self-pay

## 2023-05-08 ENCOUNTER — Ambulatory Visit
Admission: RE | Admit: 2023-05-08 | Discharge: 2023-05-08 | Disposition: A | Discharge status: Home / Self Care | Payer: No Typology Code available for payment source | Source: Ambulatory Visit | Attending: Obstetrics and Gynecology | Admitting: Obstetrics and Gynecology

## 2023-05-08 ENCOUNTER — Other Ambulatory Visit: Payer: Self-pay | Admitting: Obstetrics and Gynecology

## 2023-05-08 DIAGNOSIS — N6331 Unspecified lump in axillary tail of the right breast: Secondary | ICD-10-CM
